# Patient Record
Sex: Male | Born: 1985 | Race: Black or African American | Hispanic: No | Marital: Single | Smoking: Current every day smoker
Health system: Southern US, Community
[De-identification: ages and names within clinical notes are randomized; demographics above are authoritative.]

## PROBLEM LIST (undated history)

## (undated) DIAGNOSIS — J36 Peritonsillar abscess: Secondary | ICD-10-CM

## (undated) HISTORY — PX: INCISION AND DRAINAGE PERITONSILLAR ABSCESS: SUR670

---

## 2018-05-01 ENCOUNTER — Other Ambulatory Visit: Payer: Self-pay

## 2018-05-01 ENCOUNTER — Emergency Department (HOSPITAL_BASED_OUTPATIENT_CLINIC_OR_DEPARTMENT_OTHER)
Admission: EM | Admit: 2018-05-01 | Discharge: 2018-05-01 | Disposition: A | Discharge status: Home / Self Care | Payer: Self-pay | Attending: Emergency Medicine | Admitting: Emergency Medicine

## 2018-05-01 ENCOUNTER — Encounter (HOSPITAL_BASED_OUTPATIENT_CLINIC_OR_DEPARTMENT_OTHER): Payer: Self-pay | Admitting: Emergency Medicine

## 2018-05-01 DIAGNOSIS — Z79899 Other long term (current) drug therapy: Secondary | ICD-10-CM | POA: Insufficient documentation

## 2018-05-01 DIAGNOSIS — F17210 Nicotine dependence, cigarettes, uncomplicated: Secondary | ICD-10-CM | POA: Insufficient documentation

## 2018-05-01 DIAGNOSIS — F1123 Opioid dependence with withdrawal: Secondary | ICD-10-CM | POA: Insufficient documentation

## 2018-05-01 MED ORDER — CLONIDINE HCL 0.1 MG PO TABS: ORAL_TABLET | ORAL | 0 refills | Status: DC

## 2018-05-01 NOTE — ED Provider Notes (Signed)
   [facility] Provider Note: Lowella DellJ. Lane Sonnie Bias, MD, FACEP  CSN: [id number] MRN: [id number] ARRIVAL: 05/01/18 at 0549 ROOM: [facility]04/[facility]04   CHIEF COMPLAINT  Withdrawal   HISTORY OF PRESENT ILLNESS  05/01/18 6:07 AM Jason Rosario is a 32 y.o. male who was in prison for 12 years and was discharged 4 days ago.  For the past 3 years he has been surreptitiously using Suboxone and has developed an addiction.  Since being discharged from prison he is developed Suboxone withdrawal.  He reports feeling fatigued, unable to sleep, restless and unable to eat or drink.  He is requesting help.   History reviewed. No pertinent past medical history.  History reviewed. No pertinent surgical history.  No family history on file.  Social History   Tobacco Use  . Smoking status: Current Every Day Smoker  Substance Use Topics  . Alcohol use: Yes  . Drug use: Not on file    Prior to Admission medications   Medication Sig Start Date End Date Taking? Authorizing Provider  Buprenorphine HCl-Naloxone HCl (SUBOXONE) 8-2 MG FILM Place 1 each under the tongue.   Yes [provider]    Allergies Patient has no known allergies.   REVIEW OF SYSTEMS  Negative except as noted here or in the History of Present Illness.   PHYSICAL EXAMINATION  Initial Vital Signs Blood pressure 138/65, pulse 78, temperature 98.5 F (36.9 C), temperature source Oral, resp. rate 20, height 6\' 3"  (1.905 m), weight 99.8 kg, SpO2 96 %.  Examination General: Well-developed, well-nourished male in no acute distress; appearance consistent with age of record HENT: normocephalic; atraumatic Eyes: pupils equal, round and reactive to light; extraocular muscles intact Neck: supple Heart: regular rate and rhythm Lungs: clear to auscultation bilaterally Abdomen: soft; nondistended; nontender; bowel sounds present Extremities: No deformity; full range of motion; pulses normal Neurologic: Awake, alert and  oriented; motor function intact in all extremities and symmetric; no facial droop; no tremor Skin: Warm and dry; no piloerection Psychiatric: Flat affect   RESULTS  Summary of this visit's results, reviewed by myself:   EKG Interpretation  Date/Time:    Ventricular Rate:    PR Interval:    QRS Duration:   QT Interval:    QTC Calculation:   R Axis:     Text Interpretation:        Laboratory Studies: No results found for this or any previous visit (from the past 24 hour(s)). Imaging Studies: No results found.  ED COURSE and MDM  Nursing notes and initial vitals signs, including pulse oximetry, reviewed.  Vitals:   05/01/18 0555  BP: 138/65  Pulse: 78  Resp: 20  Temp: 98.5 F (36.9 C)  TempSrc: Oral  SpO2: 96%  Weight: 99.8 kg  Height: 6\' 3"  (1.905 m)   Will place on clonidine taper and refer to outpatient drug rehab facilities.  PROCEDURES    ED DIAGNOSES     ICD-10-CM   1. Opioid dependence with withdrawal (HCC) F11.23        Paula LibraMolpus, Caramia Boutin, MD 05/01/18 [phone]

## 2018-05-01 NOTE — ED Triage Notes (Signed)
Pt states he has been on suboxone x 3 years. Pt was released from prison x4 days ago and does not have a prescription for suboxone. Pt reports feeling fatigued, unable to sleep, restless, unable to eat or drink.

## 2018-06-19 ENCOUNTER — Other Ambulatory Visit: Payer: Self-pay

## 2018-06-19 ENCOUNTER — Encounter (HOSPITAL_BASED_OUTPATIENT_CLINIC_OR_DEPARTMENT_OTHER): Payer: Self-pay

## 2018-06-19 ENCOUNTER — Emergency Department (HOSPITAL_BASED_OUTPATIENT_CLINIC_OR_DEPARTMENT_OTHER)
Admission: EM | Admit: 2018-06-19 | Discharge: 2018-06-19 | Disposition: A | Discharge status: Home / Self Care | Payer: Self-pay | Attending: Emergency Medicine | Admitting: Emergency Medicine

## 2018-06-19 DIAGNOSIS — Z79899 Other long term (current) drug therapy: Secondary | ICD-10-CM | POA: Insufficient documentation

## 2018-06-19 DIAGNOSIS — F17210 Nicotine dependence, cigarettes, uncomplicated: Secondary | ICD-10-CM | POA: Insufficient documentation

## 2018-06-19 DIAGNOSIS — B9789 Other viral agents as the cause of diseases classified elsewhere: Secondary | ICD-10-CM

## 2018-06-19 DIAGNOSIS — J029 Acute pharyngitis, unspecified: Secondary | ICD-10-CM | POA: Insufficient documentation

## 2018-06-19 DIAGNOSIS — J028 Acute pharyngitis due to other specified organisms: Secondary | ICD-10-CM

## 2018-06-19 LAB — GROUP A STREP BY PCR: GROUP A STREP BY PCR: NOT DETECTED

## 2018-06-19 MED ORDER — IBUPROFEN 600 MG PO TABS: 600.0000 mg | ORAL_TABLET | Freq: Four times a day (QID) | ORAL | 0 refills | Status: DC | PRN

## 2018-06-19 MED ORDER — DEXAMETHASONE SODIUM PHOSPHATE 10 MG/ML IJ SOLN
10.0000 mg | Freq: Once | INTRAMUSCULAR | Status: AC
  Administered 2018-06-19: 10 mg via INTRAMUSCULAR
  Filled 2018-06-19: qty 1

## 2018-06-19 MED ORDER — LIDOCAINE VISCOUS HCL 2 % MT SOLN
15.0000 mL | Freq: Once | OROMUCOSAL | Status: AC
  Administered 2018-06-19: 15 mL via OROMUCOSAL
  Filled 2018-06-19: qty 15

## 2018-06-19 MED ORDER — LIDOCAINE VISCOUS HCL 2 % MT SOLN: 15.0000 mL | OROMUCOSAL | 0 refills | Status: DC | PRN

## 2018-06-19 MED FILL — IBUPROFEN 600 MG TABLET: 600 | 7 days supply | Qty: 30 | Fill #0

## 2018-06-19 MED FILL — LIDOCAINE 2% VISCOUS SOLN: 2 | 7 days supply | Qty: 100 | Fill #0

## 2018-06-19 NOTE — ED Provider Notes (Signed)
[facility] EMERGENCY DEPARTMENT Provider Note   CSN: [id number] Arrival date & time: 06/19/18  1411     History   Chief Complaint Chief Complaint  Patient presents with  . URI    HPI Jason Rosario is a 32 y.o. male with no pertinent past medical history who presents to the emergency department with a chief complaint of bilateral sore throat for the last 5 to 6 days.    He reports associated worsening dysphagia over the last 2 days, but has been able to eat and drink.  Also reports headache for the last few days, and left otalgia, onset today.  He denies cough, trismus, drooling, muffled voice, shortness of breath, chest pain, nasal congestion, sinus pain or pressure, myalgias, fever, chills, dental pain, facial or neck swelling, neck pain or stiffness, or tinnitus.  He is not up-to-date on his influenza vaccination this year.  He smokes a pack of Black and mild daily.  No known sick contacts.  He reports he took 2 doses of Mucinex starting last night and 1 dose of Tylenol with no improvement in his symptoms.  The history is provided by the patient. No language interpreter was used.    History reviewed. No pertinent past medical history.  There are no active problems to display for this patient.   History reviewed. No pertinent surgical history.      Home Medications    Prior to Admission medications   Medication Sig Start Date End Date Taking? Authorizing Provider  cloNIDine (CATAPRES) 0.1 MG tablet Days 1-4: Two tablets four times a day. Days 5-6: One tablet four times a day. Days 7-8: 1/2 tablet four times a day. Cut doses in half or discontinue if you feel lightheaded or pass out. 05/01/18   Molpus, Jonny Ruiz, MD  ibuprofen (ADVIL,MOTRIN) 600 MG tablet Take 1 tablet (600 mg total) by mouth every 6 (six) hours as needed. 06/19/18   Karis Rilling A, PA-C  lidocaine (XYLOCAINE) 2 % solution Use as directed 15 mLs in the mouth or throat as needed for mouth pain.  06/19/18   Jaremy Nosal A, PA-C    Family History No family history on file.  Social History Social History   Tobacco Use  . Smoking status: Current Every Day Smoker    Types: Cigarettes  . Smokeless tobacco: Never Used  Substance Use Topics  . Alcohol use: Yes    Comment: occ  . Drug use: Never     Allergies   Patient has no known allergies.   Review of Systems Review of Systems  Constitutional: Negative for activity change, appetite change, chills and fever.  HENT: Positive for ear pain and sore throat. Negative for congestion, dental problem, drooling, facial swelling, rhinorrhea, sinus pressure, sinus pain, trouble swallowing and voice change.   Respiratory: Negative for cough and shortness of breath.   Cardiovascular: Negative for chest pain.  Gastrointestinal: Negative for abdominal pain, diarrhea, nausea and vomiting.  Genitourinary: Negative for dysuria.  Musculoskeletal: Negative for back pain.  Skin: Negative for rash.  Allergic/Immunologic: Negative for immunocompromised state.  Neurological: Negative for headaches.  Psychiatric/Behavioral: Negative for confusion.     Physical Exam Updated Vital Signs BP (!) 157/83 (BP Location: Right Arm)   Pulse 86   Temp 98.7 F (37.1 C) (Oral)   Resp 16   Ht 6\' 3"  (1.905 m)   Wt 99.8 kg   SpO2 98%   BMI 27.50 kg/m   Physical Exam  Constitutional: He appears well-developed.  HENT:  Head: Normocephalic.  Right Ear: Hearing, external ear and ear canal normal. Tympanic membrane is not erythematous and not bulging. A middle ear effusion is present.  Left Ear: Hearing, external ear and ear canal normal. Tympanic membrane is not erythematous and not bulging. A middle ear effusion is present.  Nose: Mucosal edema present. No rhinorrhea. Right sinus exhibits no maxillary sinus tenderness and no frontal sinus tenderness. Left sinus exhibits no maxillary sinus tenderness and no frontal sinus tenderness.  Mouth/Throat:  Uvula is midline and mucous membranes are normal. No trismus in the jaw. No uvula swelling. No oropharyngeal exudate, posterior oropharyngeal edema, posterior oropharyngeal erythema or tonsillar abscesses. Tonsils are 2+ on the right. Tonsils are 2+ on the left. Tonsillar exudate.  No sublingual edema or induration.  Eyes: Conjunctivae are normal. No scleral icterus.  Neck: Normal range of motion. Neck supple.  Shotty bilateral anterior cervical lymphadenopathy.  No meningismus.  Cardiovascular: Normal rate, regular rhythm, normal heart sounds and intact distal pulses. Exam reveals no gallop and no friction rub.  No murmur heard. Pulmonary/Chest: Effort normal and breath sounds normal. No stridor. No respiratory distress. He has no wheezes. He has no rales. He exhibits no tenderness.  Abdominal: Soft. He exhibits no distension.  Neurological: He is alert.  Skin: Skin is warm and dry.  Psychiatric: His behavior is normal.  Nursing note and vitals reviewed.    ED Treatments / Results  Labs (all labs ordered are listed, but only abnormal results are displayed) Labs Reviewed  GROUP A STREP BY PCR    EKG None  Radiology No results found.  Procedures Procedures (including critical care time)  Medications Ordered in ED Medications  dexamethasone (DECADRON) injection 10 mg (has no administration in time range)  lidocaine (XYLOCAINE) 2 % viscous mouth solution 15 mL (15 mLs Mouth/Throat Given 06/19/18 1445)     Initial Impression / Assessment and Plan / ED Course  I have reviewed the triage vital signs and the nursing notes.  Pertinent labs & imaging results that were available during my care of the patient were reviewed by me and considered in my medical decision making (see chart for details).     32 year old male with no pertinent past medical history presenting with sore throat, dysphagia, headache, and left otalgia.  No constitutional symptoms.  Vital signs are reassuring in  the ED as the patient is afebrile, normotensive, without tachycardia.  On exam, patient has 2+ bilateral tonsils with exudates and shotty anterior cervical lymphadenopathy.  Meningismus.  Low suspicion for influenza, meningitis, acute bacterial sinusitis, Ludwig's angina, peritonsillar abscess, retropharyngeal abscess, or AOM. will order strep PCR and give viscous lidocaine for pain.    Strep PCR is negative.  Decadron given in the ED. suspect viral etiology at this time.  Will discharge with viscous lidocaine and NSAIDs.  Recommended follow-up with his PCP if his symptoms do not improve in the next 4 to 5 days.  Doubt oropharyngeal STI or mononucleosis at this time.  Strict return precautions given.  He is hemodynamically stable and in no acute distress.  He is safe for discharge home with outpatient follow-up.   Final Clinical Impressions(s) / ED Diagnoses   Final diagnoses:  Acute viral pharyngitis    ED Discharge Orders         Ordered    lidocaine (XYLOCAINE) 2 % solution  As needed     06/19/18 1540    ibuprofen (ADVIL,MOTRIN) 600 MG tablet  Every 6 hours PRN  06/19/18 1540           Frederik Pear A, PA-C 06/19/18 1544    Maia Plan, MD 06/19/18 [phone]

## 2018-06-19 NOTE — Discharge Instructions (Addendum)
Thank you for allowing me to care for you today in the Emergency Department.   You can swish and spit warm salt water every 6 hours to help with sore throat and pain in her mouth.  Take 600 mg of ibuprofen every 6 hours with food to help with pain, inflammation, and headache.  You can swallow 15 mL of viscous lidocaine every 3 hours as needed for sore throat.  Eating and drinking hot and cold beverages may also be easier to swallow than things that are room temperature.  Call the number on your discharge paper to get established with a primary care provider for follow-up if your symptoms do not start to improve in the next 4 to 5 days.  Return to the emergency department if you develop difficulty breathing, feeling as if your throat is closing, if you become unable to open your mouth, if you develop significant swelling to one side of your face or neck, or other new, concerning symptoms.

## 2018-06-19 NOTE — ED Triage Notes (Signed)
C/o URI x 5 days-NAD-steady gait

## 2018-06-24 ENCOUNTER — Other Ambulatory Visit: Payer: Self-pay

## 2018-06-24 ENCOUNTER — Emergency Department (HOSPITAL_BASED_OUTPATIENT_CLINIC_OR_DEPARTMENT_OTHER): Payer: Self-pay

## 2018-06-24 ENCOUNTER — Emergency Department (HOSPITAL_BASED_OUTPATIENT_CLINIC_OR_DEPARTMENT_OTHER)
Admission: EM | Admit: 2018-06-24 | Discharge: 2018-06-24 | Disposition: A | Discharge status: Home / Self Care | Payer: Self-pay | Attending: Emergency Medicine | Admitting: Emergency Medicine

## 2018-06-24 ENCOUNTER — Encounter (HOSPITAL_BASED_OUTPATIENT_CLINIC_OR_DEPARTMENT_OTHER): Payer: Self-pay

## 2018-06-24 DIAGNOSIS — F17210 Nicotine dependence, cigarettes, uncomplicated: Secondary | ICD-10-CM | POA: Insufficient documentation

## 2018-06-24 DIAGNOSIS — J36 Peritonsillar abscess: Secondary | ICD-10-CM | POA: Insufficient documentation

## 2018-06-24 DIAGNOSIS — Z79899 Other long term (current) drug therapy: Secondary | ICD-10-CM | POA: Insufficient documentation

## 2018-06-24 LAB — CBC WITH DIFFERENTIAL/PLATELET
Abs Immature Granulocytes: 0.02 10*3/uL (ref 0.00–0.07)
BASOS PCT: 1 %
Basophils Absolute: 0.1 10*3/uL (ref 0.0–0.1)
EOS ABS: 0.3 10*3/uL (ref 0.0–0.5)
Eosinophils Relative: 3 %
HCT: 45 % (ref 39.0–52.0)
Hemoglobin: 14.5 g/dL (ref 13.0–17.0)
Immature Granulocytes: 0 %
Lymphocytes Relative: 12 %
Lymphs Abs: 1.3 10*3/uL (ref 0.7–4.0)
MCH: 31.7 pg (ref 26.0–34.0)
MCHC: 32.2 g/dL (ref 30.0–36.0)
MCV: 98.3 fL (ref 80.0–100.0)
MONO ABS: 0.9 10*3/uL (ref 0.1–1.0)
MONOS PCT: 8 %
NEUTROS PCT: 76 %
Neutro Abs: 8.3 10*3/uL — ABNORMAL HIGH (ref 1.7–7.7)
Platelets: 282 10*3/uL (ref 150–400)
RBC: 4.58 MIL/uL (ref 4.22–5.81)
RDW: 13.1 % (ref 11.5–15.5)
WBC: 10.9 10*3/uL — AB (ref 4.0–10.5)
nRBC: 0 % (ref 0.0–0.2)

## 2018-06-24 LAB — GROUP A STREP BY PCR: GROUP A STREP BY PCR: NOT DETECTED

## 2018-06-24 MED ORDER — CLINDAMYCIN PHOSPHATE 600 MG/50ML IV SOLN
600.0000 mg | Freq: Once | INTRAVENOUS | Status: AC
  Administered 2018-06-24: 600 mg via INTRAVENOUS
  Filled 2018-06-24: qty 50

## 2018-06-24 MED ORDER — DEXAMETHASONE SODIUM PHOSPHATE 10 MG/ML IJ SOLN
10.0000 mg | Freq: Once | INTRAMUSCULAR | Status: AC
  Administered 2018-06-24: 10 mg via INTRAVENOUS
  Filled 2018-06-24: qty 1

## 2018-06-24 MED ORDER — IOPAMIDOL (ISOVUE-300) INJECTION 61%
100.0000 mL | Freq: Once | INTRAVENOUS | Status: AC | PRN
  Administered 2018-06-24: 75 mL via INTRAVENOUS

## 2018-06-24 MED ORDER — SODIUM CHLORIDE 0.9 % IV BOLUS
1000.0000 mL | Freq: Once | INTRAVENOUS | Status: AC
  Administered 2018-06-24: 1000 mL via INTRAVENOUS

## 2018-06-24 MED ORDER — KETOROLAC TROMETHAMINE 15 MG/ML IJ SOLN
15.0000 mg | Freq: Once | INTRAMUSCULAR | Status: AC
  Administered 2018-06-24: 15 mg via INTRAVENOUS
  Filled 2018-06-24: qty 1

## 2018-06-24 NOTE — ED Provider Notes (Signed)
[facility] EMERGENCY DEPARTMENT Provider Note   CSN: [id number] Arrival date & time: 06/24/18  1104     History   Chief Complaint Chief Complaint  Patient presents with  . Sore Throat    HPI Jason Rosario is a 32 y.o. male in for evaluation of sore throat.  Patient states he has had a sore throat for the past 2 weeks.  He was seen 5 days ago, had a negative strep test and discharged with a likely viral illness.  Patient reports symptoms have worsened in the past several days, despite viscous lidocaine and Motrin.  Patient reports increased pain, pain extending into his jaw, and increased left ear pain.  He reports difficulty swallowing due to pain.  He has associated sinus congestion.  He denies fevers, chills, cough, chest pain, nausea, vomiting, abdominal pain.  He has no medical problems, takes no medications daily.  He is immunocompetent. He denies sick contacts. Patient states 2 months ago, he had a left lower tooth extraction, had no issues since.  He reports pain in the left jaw, but not of the tooth itself. Pt states his voice sounds muffled, and it hurts to open his mouth.   HPI  History reviewed. No pertinent past medical history.  There are no active problems to display for this patient.   History reviewed. No pertinent surgical history.      Home Medications    Prior to Admission medications   Medication Sig Start Date End Date Taking? Authorizing Provider  ibuprofen (ADVIL,MOTRIN) 600 MG tablet Take 1 tablet (600 mg total) by mouth every 6 (six) hours as needed. 06/19/18  Yes McDonald, Mia A, PA-C  lidocaine (XYLOCAINE) 2 % solution Use as directed 15 mLs in the mouth or throat as needed for mouth pain. 06/19/18  Yes McDonald, Mia A, PA-C  cloNIDine (CATAPRES) 0.1 MG tablet Days 1-4: Two tablets four times a day. Days 5-6: One tablet four times a day. Days 7-8: 1/2 tablet four times a day. Cut doses in half or discontinue if you feel lightheaded or  pass out. 05/01/18   Molpus, Jonny Ruiz, MD    Family History No family history on file.  Social History Social History   Tobacco Use  . Smoking status: Current Every Day Smoker    Types: Cigarettes  . Smokeless tobacco: Never Used  Substance Use Topics  . Alcohol use: Yes    Comment: occ  . Drug use: Never     Allergies   Patient has no known allergies.   Review of Systems Review of Systems  HENT: Positive for congestion, rhinorrhea, sinus pressure, sinus pain, sore throat, trouble swallowing and voice change.        L low jaw pain  All other systems reviewed and are negative.    Physical Exam Updated Vital Signs BP 134/80 (BP Location: Right Arm)   Pulse 84   Temp 98.8 F (37.1 C) (Oral)   Resp 20   Ht 6\' 3"  (1.905 m)   Wt 99.8 kg   SpO2 96%   BMI 27.50 kg/m   Physical Exam  Constitutional: He is oriented to person, place, and time. He appears well-developed and well-nourished.  Appears uncomfortable, but nontoxic  HENT:  Head: Normocephalic and atraumatic.  Right Ear: External ear and ear canal normal. Tympanic membrane is not erythematous and not bulging. A middle ear effusion is present.  Left Ear: External ear and ear canal normal. Tympanic membrane is not erythematous and not bulging.  A middle ear effusion is present.  Nose: Mucosal edema and rhinorrhea present. Right sinus exhibits no maxillary sinus tenderness and no frontal sinus tenderness. Left sinus exhibits maxillary sinus tenderness and frontal sinus tenderness.  Mouth/Throat: Uvula is midline. Mucous membranes are dry. There is trismus in the jaw. Posterior oropharyngeal edema and posterior oropharyngeal erythema present. Tonsils are 1+ on the right. Tonsils are 3+ on the left.  Left tonsil significantly swollen and erythematous.  No obvious exudate.  Uvula is midline.  Trismus noted.  Muffled voice.  Handling secretions.  OP erythematous and edematous.  No obvious exudate.  No tenderness under the  tongue.  No dental tenderness.  Eyes: Pupils are equal, round, and reactive to light. Conjunctivae and EOM are normal.  Neck: Normal range of motion. Neck supple.  Cardiovascular: Normal rate, regular rhythm and intact distal pulses.  Pulmonary/Chest: Effort normal and breath sounds normal. No respiratory distress. He has no wheezes.  Abdominal: Soft. He exhibits no distension. There is no tenderness.  Musculoskeletal: Normal range of motion.  Neurological: He is alert and oriented to person, place, and time.  Skin: Skin is warm and dry. Capillary refill takes less than 2 seconds.  Psychiatric: He has a normal mood and affect.  Nursing note and vitals reviewed.    ED Treatments / Results  Labs (all labs ordered are listed, but only abnormal results are displayed) Labs Reviewed  CBC WITH DIFFERENTIAL/PLATELET - Abnormal; Notable for the following components:      Result Value   WBC 10.9 (*)    Neutro Abs 8.3 (*)    All other components within normal limits  GROUP A STREP BY PCR  BASIC METABOLIC PANEL    EKG None  Radiology Ct Soft Tissue Neck W Contrast  Result Date: 06/24/2018 CLINICAL DATA:  Sore throat, difficulty swallowing. EXAM: CT NECK WITH CONTRAST TECHNIQUE: Multidetector CT imaging of the neck was performed using the standard protocol following the bolus administration of intravenous contrast. CONTRAST:  75mL ISOVUE-300 IOPAMIDOL (ISOVUE-300) INJECTION 61% COMPARISON:  None. FINDINGS: Pharynx and larynx: Extensive swelling and edema in the left tonsil. Focal fluid collection left tonsil measuring 24 x 26 mm compatible with abscess. Mild narrowing of the airway. Epiglottis and larynx normal. Salivary glands: No inflammation, mass, or stone. Thyroid: Negative Lymph nodes: Left level 2 lymph node 10 mm. Left level 5 node 16 mm. This is most likely related to acute infection. Right level 2 lymph node 9 mm. Vascular: Negative Limited intracranial: Negative Visualized orbits:  Negative Mastoids and visualized paranasal sinuses: Mild mucosal edema paranasal sinuses. Skeleton: Negative Upper chest: Negative Other: None IMPRESSION: 24 x 26 mm left tonsillar abscess with mild narrowing of the airway. Reactive adenopathy in the neck. Electronically Signed   By: Marlan Palau M.D.   On: 06/24/2018 13:40    Procedures Procedures (including critical care time)  Medications Ordered in ED Medications  sodium chloride 0.9 % bolus 1,000 mL ( Intravenous Stopped 06/24/18 1403)  clindamycin (CLEOCIN) IVPB 600 mg ( Intravenous Stopped 06/24/18 1345)  dexamethasone (DECADRON) injection 10 mg (10 mg Intravenous Given 06/24/18 1259)  ketorolac (TORADOL) 15 MG/ML injection 15 mg (15 mg Intravenous Given 06/24/18 1259)  iopamidol (ISOVUE-300) 61 % injection 100 mL (75 mLs Intravenous Contrast Given 06/24/18 1311)     Initial Impression / Assessment and Plan / ED Course  I have reviewed the triage vital signs and the nursing notes.  Pertinent labs & imaging results that were available during my care  of the patient were reviewed by me and considered in my medical decision making (see chart for details).     Pt presenting for evaluation of sore throat.  Physical exam concerning, patient with muscle voice, trismus, and obviously swollen left tonsil.  Concern for peritonsillar abscess.  Additionally, patient with left low jaw pain, consider possible Ludwig's, although lower suspicion due to no pain under the tongue or dental pain.  Will start fluids, Decadron, and antibiotics while CT is pending.  Labs with mild leukocytosis of 10.9, otherwise reassuring. CT shows signs of 2-1/2 x 2-1/2 cm left peritonsillar abscess.  Discussed with Dr. Lazarus Salines from ENT, who recommends pt go immediately to his office for I and D.  Reassessment, patient reports symptoms are improved with IV medications.  Discussed plan to follow-up with Dr. Lazarus Salines.  Discussed patient is to go immediately, he is not to  stop to eat or drink.  Patient is agreeable to plan.  At this time, patient appears safe for discharge.  Return percussions given.  Patient states he understands.   Final Clinical Impressions(s) / ED Diagnoses   Final diagnoses:  Peritonsillar abscess    ED Discharge Orders    None       Alveria Apley, PA-C 06/24/18 1442    Gwyneth Sprout, MD 06/24/18 [phone]

## 2018-06-24 NOTE — Discharge Instructions (Addendum)
Drive directly to Dr. Raye SorrowWolicki's office.  The information for his office is listed below. When you show up at the office, tell them that you were in the ER and were told to follow-up in the office for your peritonsillar abscess. Do not stop and get anything to eat or drink on the way.

## 2018-06-24 NOTE — ED Notes (Signed)
Pt to report to ENT and remain NPO. Pt voiced understanding. No concerns or questions at this time.

## 2018-06-24 NOTE — ED Triage Notes (Signed)
Pt seen on 11/7 for sore throat-states is worse-NAD-steady gait

## 2018-08-16 ENCOUNTER — Encounter (HOSPITAL_BASED_OUTPATIENT_CLINIC_OR_DEPARTMENT_OTHER): Payer: Self-pay

## 2018-08-16 ENCOUNTER — Emergency Department (HOSPITAL_BASED_OUTPATIENT_CLINIC_OR_DEPARTMENT_OTHER)
Admission: EM | Admit: 2018-08-16 | Discharge: 2018-08-16 | Disposition: A | Discharge status: Home / Self Care | Payer: Self-pay | Attending: Emergency Medicine | Admitting: Emergency Medicine

## 2018-08-16 ENCOUNTER — Other Ambulatory Visit: Payer: Self-pay

## 2018-08-16 DIAGNOSIS — Z79899 Other long term (current) drug therapy: Secondary | ICD-10-CM | POA: Insufficient documentation

## 2018-08-16 DIAGNOSIS — F17210 Nicotine dependence, cigarettes, uncomplicated: Secondary | ICD-10-CM | POA: Insufficient documentation

## 2018-08-16 DIAGNOSIS — J36 Peritonsillar abscess: Secondary | ICD-10-CM | POA: Insufficient documentation

## 2018-08-16 LAB — CBC WITH DIFFERENTIAL/PLATELET
Abs Immature Granulocytes: 0.03 10*3/uL (ref 0.00–0.07)
BASOS ABS: 0.1 10*3/uL (ref 0.0–0.1)
Basophils Relative: 1 %
EOS ABS: 0.1 10*3/uL (ref 0.0–0.5)
EOS PCT: 1 %
HEMATOCRIT: 47.9 % (ref 39.0–52.0)
HEMOGLOBIN: 15.7 g/dL (ref 13.0–17.0)
Immature Granulocytes: 0 %
LYMPHS PCT: 15 %
Lymphs Abs: 1.6 10*3/uL (ref 0.7–4.0)
MCH: 32.6 pg (ref 26.0–34.0)
MCHC: 32.8 g/dL (ref 30.0–36.0)
MCV: 99.6 fL (ref 80.0–100.0)
Monocytes Absolute: 1 10*3/uL (ref 0.1–1.0)
Monocytes Relative: 9 %
NEUTROS ABS: 8 10*3/uL — AB (ref 1.7–7.7)
Neutrophils Relative %: 74 %
Platelets: 237 10*3/uL (ref 150–400)
RBC: 4.81 MIL/uL (ref 4.22–5.81)
RDW: 13.2 % (ref 11.5–15.5)
WBC: 10.8 10*3/uL — AB (ref 4.0–10.5)
nRBC: 0 % (ref 0.0–0.2)

## 2018-08-16 LAB — BASIC METABOLIC PANEL
Anion gap: 7 (ref 5–15)
BUN: 6 mg/dL (ref 6–20)
CO2: 32 mmol/L (ref 22–32)
Calcium: 10.1 mg/dL (ref 8.9–10.3)
Chloride: 98 mmol/L (ref 98–111)
Creatinine, Ser: 0.83 mg/dL (ref 0.61–1.24)
GFR calc Af Amer: >60 mL/min (ref >60)
GFR calc non Af Amer: >60 mL/min (ref >60)
Glucose, Bld: 82 mg/dL (ref 70–99)
POTASSIUM: 4.2 mmol/L (ref 3.5–5.1)
SODIUM: 137 mmol/L (ref 135–145)

## 2018-08-16 LAB — GROUP A STREP BY PCR: Group A Strep by PCR: NOT DETECTED

## 2018-08-16 MED ORDER — CLINDAMYCIN PHOSPHATE 600 MG/50ML IV SOLN
600.0000 mg | Freq: Once | INTRAVENOUS | Status: AC
  Administered 2018-08-16: 600 mg via INTRAVENOUS
  Filled 2018-08-16: qty 50

## 2018-08-16 MED ORDER — CLINDAMYCIN HCL 150 MG PO CAPS: 450.0000 mg | ORAL_CAPSULE | Freq: Four times a day (QID) | ORAL | 0 refills | Status: AC

## 2018-08-16 MED ORDER — SODIUM CHLORIDE 0.9 % IV BOLUS
1000.0000 mL | Freq: Once | INTRAVENOUS | Status: AC
  Administered 2018-08-16: 1000 mL via INTRAVENOUS

## 2018-08-16 MED ORDER — DEXAMETHASONE SODIUM PHOSPHATE 10 MG/ML IJ SOLN
10.0000 mg | Freq: Once | INTRAMUSCULAR | Status: AC
  Administered 2018-08-16: 10 mg via INTRAVENOUS
  Filled 2018-08-16: qty 1

## 2018-08-16 MED ORDER — PROBIOTIC 250 MG PO CAPS: 1.0000 | ORAL_CAPSULE | Freq: Every day | ORAL | 0 refills | Status: DC

## 2018-08-16 MED ORDER — SODIUM CHLORIDE 0.9 % IV SOLN
INTRAVENOUS | Status: DC | PRN
  Administered 2018-08-16: 250 mL via INTRAVENOUS

## 2018-08-16 NOTE — Discharge Instructions (Signed)
Take clindamycin with food as prescribed for 14 days unless told otherwise by Dr. Lazarus SalinesWolicki.  Take a probiotic daily while taking this medication.  Please call Dr. Raye SorrowWolicki's office Monday to make an appointment next week.  Please return to the emergency department if you develop any new or worsening symptoms including fever over 100.4, lockjaw, feeling like you are going to pass out, or any other concerning symptoms.

## 2018-08-16 NOTE — ED Notes (Signed)
ED Provider at bedside. 

## 2018-08-16 NOTE — ED Provider Notes (Signed)
[facility] EMERGENCY DEPARTMENT Provider Note   CSN: [id number] Arrival date & time: 08/16/18  1424     History   Chief Complaint Chief Complaint  Patient presents with  . Sore Throat    HPI Jason Rosario is a 33 y.o. male with recent history of peritonsillar abscess who presents with return of sore throat and drainage to the back of his throat.  He denies any fever.  He reports his symptoms returned over the past few days.  He finished amoxicillin about 3 weeks ago.  He had an incision and drainage of the peritonsillar abscess in the office on 06/24/2018 by Dr. Lazarus Salines.  He reports he had gotten better.  He has not been taking any medications at home for symptoms.  He does have some change in his voice, however he is able to tolerate fluids and eat.  HPI  History reviewed. No pertinent past medical history.  There are no active problems to display for this patient.   Past Surgical History:  Procedure Laterality Date  . INCISION AND DRAINAGE PERITONSILLAR ABSCESS          Home Medications    Prior to Admission medications   Medication Sig Start Date End Date Taking? Authorizing Provider  clindamycin (CLEOCIN) 150 MG capsule Take 3 capsules (450 mg total) by mouth every 6 (six) hours for 14 days. 08/16/18 08/30/18  Emi Holes, PA-C  cloNIDine (CATAPRES) 0.1 MG tablet Days 1-4: Two tablets four times a day. Days 5-6: One tablet four times a day. Days 7-8: 1/2 tablet four times a day. Cut doses in half or discontinue if you feel lightheaded or pass out. 05/01/18   Molpus, Jonny Ruiz, MD  ibuprofen (ADVIL,MOTRIN) 600 MG tablet Take 1 tablet (600 mg total) by mouth every 6 (six) hours as needed. 06/19/18   McDonald, Mia A, PA-C  lidocaine (XYLOCAINE) 2 % solution Use as directed 15 mLs in the mouth or throat as needed for mouth pain. 06/19/18   McDonald, Mia A, PA-C  Saccharomyces boulardii (PROBIOTIC) 250 MG CAPS Take 1 capsule by mouth daily. 08/16/18   Emi Holes,  PA-C    Family History No family history on file.  Social History Social History   Tobacco Use  . Smoking status: Current Every Day Smoker    Types: Cigarettes  . Smokeless tobacco: Never Used  Substance Use Topics  . Alcohol use: Yes    Comment: occ  . Drug use: Never     Allergies   Patient has no known allergies.   Review of Systems Review of Systems  Constitutional: Negative for chills and fever.  HENT: Positive for sore throat and trouble swallowing. Negative for facial swelling.   Respiratory: Negative for shortness of breath.   Cardiovascular: Negative for chest pain.  Gastrointestinal: Negative for abdominal pain, nausea and vomiting.  Genitourinary: Negative for dysuria.  Musculoskeletal: Negative for back pain and neck pain.  Skin: Negative for rash and wound.  Neurological: Negative for headaches.  Psychiatric/Behavioral: The patient is not nervous/anxious.      Physical Exam Updated Vital Signs BP 136/87 (BP Location: Left Arm)   Pulse 84   Temp 99.5 F (37.5 C) (Oral)   Resp 20   Ht 6\' 3"  (1.905 m)   Wt 95.3 kg   SpO2 99%   BMI 26.25 kg/m   Physical Exam Vitals signs and nursing note reviewed.  Constitutional:      General: He is not in acute distress.  Appearance: He is well-developed. He is not diaphoretic.  HENT:     Head: Normocephalic and atraumatic.     Right Ear: Tympanic membrane normal.     Left Ear: Tympanic membrane normal.     Mouth/Throat:     Pharynx: No oropharyngeal exudate.     Tonsils: Tonsillar exudate and tonsillar abscess present. Swelling: 3+ on the right. 3+ on the left.     Comments: Tonsillar hypertrophy bilaterally, peritonsillar abscess actively draining yellow, purulent fluid on the left Eyes:     General: No scleral icterus.       Right eye: No discharge.        Left eye: No discharge.     Conjunctiva/sclera: Conjunctivae normal.     Pupils: Pupils are equal, round, and reactive to light.  Neck:      Musculoskeletal: Normal range of motion and neck supple.     Thyroid: No thyromegaly.  Cardiovascular:     Rate and Rhythm: Normal rate and regular rhythm.     Heart sounds: Normal heart sounds. No murmur. No friction rub. No gallop.   Pulmonary:     Effort: Pulmonary effort is normal. No respiratory distress.     Breath sounds: Normal breath sounds. No stridor. No wheezing or rales.  Abdominal:     General: Bowel sounds are normal. There is no distension.     Palpations: Abdomen is soft.     Tenderness: There is no abdominal tenderness. There is no guarding or rebound.  Lymphadenopathy:     Cervical: No cervical adenopathy.  Skin:    General: Skin is warm and dry.     Coloration: Skin is not pale.     Findings: No rash.  Neurological:     Mental Status: He is alert.     Coordination: Coordination normal.      ED Treatments / Results  Labs (all labs ordered are listed, but only abnormal results are displayed) Labs Reviewed  CBC WITH DIFFERENTIAL/PLATELET - Abnormal; Notable for the following components:      Result Value   WBC 10.8 (*)    Neutro Abs 8.0 (*)    All other components within normal limits  GROUP A STREP BY PCR  AEROBIC CULTURE (SUPERFICIAL SPECIMEN)  BASIC METABOLIC PANEL    EKG None  Radiology No results found.  Procedures Procedures (including critical care time)  Medications Ordered in ED Medications  0.9 %  sodium chloride infusion (250 mLs Intravenous New Bag/Given 08/16/18 1652)  dexamethasone (DECADRON) injection 10 mg (10 mg Intravenous Given 08/16/18 1647)  sodium chloride 0.9 % bolus 1,000 mL (0 mLs Intravenous Stopped 08/16/18 1800)  clindamycin (CLEOCIN) IVPB 600 mg (0 mg Intravenous Stopped 08/16/18 1729)     Initial Impression / Assessment and Plan / ED Course  I have reviewed the triage vital signs and the nursing notes.  Pertinent labs & imaging results that were available during my care of the patient were reviewed by me and  considered in my medical decision making (see chart for details).     Patient with mild leukocytosis at 10.8.  Otherwise labs are unremarkable.  Strep is negative.  Patient with return of peritonsillar abscess, which is actively draining in the ED.  Patient given IV clindamycin, Decadron, fluids in the ED.  I discussed patient case with ENT specialist on-call, Dr. Pollyann Kennedyosen, who advised to follow-up in the office this week and oral clindamycin.  Patient understands and agrees with plan.  Return precautions discussed.  Patient vitals stable throughout ED course and discharged in satisfactory condition. I discussed patient case with Dr. Donnald Garre who guided the patient's management and agrees with plan.   Final Clinical Impressions(s) / ED Diagnoses   Final diagnoses:  Peritonsillar abscess    ED Discharge Orders         Ordered    clindamycin (CLEOCIN) 150 MG capsule  Every 6 hours     08/16/18 1756    Saccharomyces boulardii (PROBIOTIC) 250 MG CAPS  Daily     08/16/18 1756           Emi Holes, PA-C 08/16/18 1804    Arby Barrette, MD 08/17/18 2212

## 2018-08-16 NOTE — ED Triage Notes (Signed)
Pt c/o sore throat with swelling. Pt reports he had a peritonsillar abscess x 1 month ago, but symptoms have returned. Note swelling and purulent exudate in triage. Pt drinking water during triage. No resp distress.

## 2018-08-19 LAB — AEROBIC CULTURE  (SUPERFICIAL SPECIMEN)

## 2018-08-19 LAB — AEROBIC CULTURE W GRAM STAIN (SUPERFICIAL SPECIMEN)
Culture: NORMAL
Special Requests: NORMAL

## 2018-10-14 ENCOUNTER — Emergency Department (HOSPITAL_COMMUNITY)
Admission: EM | Admit: 2018-10-14 | Discharge: 2018-10-15 | Disposition: A | Discharge status: Home / Self Care | Payer: Self-pay | Attending: Emergency Medicine | Admitting: Emergency Medicine

## 2018-10-14 ENCOUNTER — Encounter (HOSPITAL_COMMUNITY): Payer: Self-pay | Admitting: Emergency Medicine

## 2018-10-14 ENCOUNTER — Other Ambulatory Visit: Payer: Self-pay

## 2018-10-14 DIAGNOSIS — K5900 Constipation, unspecified: Secondary | ICD-10-CM | POA: Insufficient documentation

## 2018-10-14 DIAGNOSIS — Z5321 Procedure and treatment not carried out due to patient leaving prior to being seen by health care provider: Secondary | ICD-10-CM | POA: Insufficient documentation

## 2018-10-14 NOTE — ED Triage Notes (Signed)
Pt c/o harden stools x 2 weeks. Pt states when he strains to use the restroom there is blood in the toilet.

## 2019-05-27 ENCOUNTER — Emergency Department
Admission: EM | Admit: 2019-05-27 | Discharge: 2019-05-27 | Discharge status: Absent without leave | Payer: Self-pay | Source: Home / Self Care

## 2019-05-27 ENCOUNTER — Emergency Department (INDEPENDENT_AMBULATORY_CARE_PROVIDER_SITE_OTHER): Payer: Self-pay

## 2019-05-27 ENCOUNTER — Encounter: Payer: Self-pay | Admitting: Family Medicine

## 2019-05-27 ENCOUNTER — Emergency Department
Admission: EM | Admit: 2019-05-27 | Discharge: 2019-05-27 | Disposition: A | Discharge status: Home / Self Care | Payer: Self-pay | Source: Home / Self Care

## 2019-05-27 ENCOUNTER — Other Ambulatory Visit: Payer: Self-pay

## 2019-05-27 DIAGNOSIS — R05 Cough: Secondary | ICD-10-CM

## 2019-05-27 DIAGNOSIS — B9789 Other viral agents as the cause of diseases classified elsewhere: Secondary | ICD-10-CM

## 2019-05-27 DIAGNOSIS — J069 Acute upper respiratory infection, unspecified: Secondary | ICD-10-CM

## 2019-05-27 DIAGNOSIS — R059 Cough, unspecified: Secondary | ICD-10-CM

## 2019-05-27 DIAGNOSIS — R0602 Shortness of breath: Secondary | ICD-10-CM

## 2019-05-27 MED ORDER — ALBUTEROL SULFATE HFA 108 (90 BASE) MCG/ACT IN AERS: 2.0000 | INHALATION_SPRAY | RESPIRATORY_TRACT | 1 refills | Status: DC | PRN

## 2019-05-27 MED ORDER — AZITHROMYCIN 250 MG PO TABS: ORAL_TABLET | ORAL | 0 refills | Status: DC

## 2019-05-27 NOTE — ED Provider Notes (Signed)
[facility]    CSN: [id number] Arrival date & time: 05/27/19  1358      History   Chief Complaint Chief Complaint  Patient presents with  . Cough    HPI Jason Rosario is a 33 y.o. male.   33 year old male making initial visit to [facility] urgent care.  Patient's chief complaint is cough and shortness of breath today.  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  He is furloughed from his [employer] job and lives with his mother, who is well.  The problem began 2 days ago with worsening cough, productive of thick yellow phlegm, and shortnss of breath and wheezing.  No h/o asthma or use of inhalers.      History reviewed. No pertinent past medical history.  There are no active problems to display for this patient.   Past Surgical History:  Procedure Laterality Date  . INCISION AND DRAINAGE PERITONSILLAR ABSCESS         Home Medications    Prior to Admission medications   Medication Sig Start Date End Date Taking? Authorizing Provider  albuterol (VENTOLIN HFA) 108 (90 Base) MCG/ACT inhaler Inhale 2 puffs into the lungs every 4 (four) hours as needed for wheezing or shortness of breath (cough, shortness of breath or wheezing.). 05/27/19   Elvina Sidle, MD  azithromycin (ZITHROMAX) 250 MG tablet Take 2 tabs PO x 1 dose, then 1 tab PO QD x 4 days 05/27/19   Elvina Sidle, MD  ibuprofen (ADVIL,MOTRIN) 600 MG tablet Take 1 tablet (600 mg total) by mouth every 6 (six) hours as needed. 06/19/18   McDonald, Mia A, PA-C    Family History Family History  Problem Relation Age of Onset  . Healthy Mother   . Healthy Father     Social History Social History   Tobacco Use  . Smoking status: Current Every Day Smoker    Types: Cigarettes  . Smokeless tobacco: Never Used  Substance Use Topics  . Alcohol use: Yes    Comment: occ  . Drug use: Never     Allergies   Patient has no known allergies.   Review of Systems Review of  Systems  Constitutional: Positive for fatigue. Negative for chills, diaphoresis and fever.  HENT: Positive for congestion.   Respiratory: Positive for cough, shortness of breath and wheezing.   Gastrointestinal: Negative.   Genitourinary: Negative.   Musculoskeletal: Negative.   Neurological: Negative.      Physical Exam Triage Vital Signs ED Triage Vitals  Enc Vitals Group     BP      Pulse      Resp      Temp      Temp src      SpO2      Weight      Height      Head Circumference      Peak Flow      Pain Score      Pain Loc      Pain Edu?      Excl. in GC?    No data found.  Updated Vital Signs BP 134/85 (BP Location: Right Arm)   Pulse 86   Temp (!) 97.4 F (36.3 C) (Oral)   Ht 6\' 3"  (1.905 m)   Wt 90.7 kg   SpO2 91%   BMI 25.00 kg/m    Physical Exam Vitals signs and nursing note reviewed.  Constitutional:      Appearance: Normal appearance. He is normal  weight.  HENT:     Head: Normocephalic.  Eyes:     Conjunctiva/sclera: Conjunctivae normal.  Neck:     Musculoskeletal: Normal range of motion and neck supple.  Cardiovascular:     Rate and Rhythm: Normal rate.     Heart sounds: Normal heart sounds.  Pulmonary:     Effort: Pulmonary effort is normal.     Breath sounds: Wheezing, rhonchi and rales present.  Musculoskeletal: Normal range of motion.  Skin:    General: Skin is warm and dry.  Neurological:     General: No focal deficit present.     Mental Status: He is alert.  Psychiatric:        Mood and Affect: Mood normal.        Behavior: Behavior normal.      UC Treatments / Results  Labs (all labs ordered are listed, but only abnormal results are displayed) Labs Reviewed  OTHER LAB TEST    EKG   Radiology No results found.  Procedures Procedures (including critical care time)  Medications Ordered in UC Medications - No data to display  Initial Impression / Assessment and Plan / UC Course  I have reviewed the triage vital  signs and the nursing notes.  Pertinent labs & imaging results that were available during my care of the patient were reviewed by me and considered in my medical decision making (see chart for details).    Final Clinical Impressions(s) / UC Diagnoses   Final diagnoses:  Cough  Viral URI with cough     Discharge Instructions     The x-ray does not show pneumonia.  Instead, you appear to have a very bad bronchitis.  We are starting you on an antibiotic and inhaler.  We are also running a COVID test that should be back in 2 days.  It is very important that you pay attention to the symptoms and if you are getting more short of breath over the next 24 hours, go to the emergency department    ED Prescriptions    Medication Sig Dispense Auth. Provider   azithromycin (ZITHROMAX) 250 MG tablet Take 2 tabs PO x 1 dose, then 1 tab PO QD x 4 days 6 tablet Robyn Haber, MD   albuterol (VENTOLIN HFA) 108 (90 Base) MCG/ACT inhaler Inhale 2 puffs into the lungs every 4 (four) hours as needed for wheezing or shortness of breath (cough, shortness of breath or wheezing.). 6.7 g Robyn Haber, MD     I have reviewed the PDMP during this encounter.   Robyn Haber, MD 05/27/19 [id number]

## 2019-05-27 NOTE — ED Triage Notes (Signed)
Productive cough, thick yellow mucus, SOB x 2 days

## 2019-05-27 NOTE — Discharge Instructions (Addendum)
The x-ray does not show pneumonia.  Instead, you appear to have a very bad bronchitis.  We are starting you on an antibiotic and inhaler.  We are also running a COVID test that should be back in 2 days.  It is very important that you pay attention to the symptoms and if you are getting more short of breath over the next 24 hours, go to the emergency department

## 2020-01-20 ENCOUNTER — Encounter (HOSPITAL_BASED_OUTPATIENT_CLINIC_OR_DEPARTMENT_OTHER): Payer: Self-pay | Admitting: *Deleted

## 2020-01-20 ENCOUNTER — Other Ambulatory Visit: Payer: Self-pay

## 2020-01-20 ENCOUNTER — Emergency Department (HOSPITAL_BASED_OUTPATIENT_CLINIC_OR_DEPARTMENT_OTHER)
Admission: EM | Admit: 2020-01-20 | Discharge: 2020-01-20 | Disposition: A | Discharge status: Home / Self Care | Payer: Medicaid Other | Attending: Emergency Medicine | Admitting: Emergency Medicine

## 2020-01-20 DIAGNOSIS — F17210 Nicotine dependence, cigarettes, uncomplicated: Secondary | ICD-10-CM | POA: Insufficient documentation

## 2020-01-20 DIAGNOSIS — Z202 Contact with and (suspected) exposure to infections with a predominantly sexual mode of transmission: Secondary | ICD-10-CM | POA: Insufficient documentation

## 2020-01-20 DIAGNOSIS — Z7951 Long term (current) use of inhaled steroids: Secondary | ICD-10-CM | POA: Insufficient documentation

## 2020-01-20 LAB — URINALYSIS, ROUTINE W REFLEX MICROSCOPIC
Bilirubin Urine: NEGATIVE
Glucose, UA: NEGATIVE mg/dL
Ketones, ur: 15 mg/dL — AB
Leukocytes,Ua: NEGATIVE
Nitrite: NEGATIVE
Protein, ur: NEGATIVE mg/dL
Specific Gravity, Urine: 1.025 (ref 1.005–1.030)
pH: 7 (ref 5.0–8.0)

## 2020-01-20 LAB — URINALYSIS, MICROSCOPIC (REFLEX)

## 2020-01-20 MED ORDER — LIDOCAINE HCL (PF) 1 % IJ SOLN
INTRAMUSCULAR | Status: AC
  Administered 2020-01-20: 2 mL
  Filled 2020-01-20: qty 5

## 2020-01-20 MED ORDER — CEFTRIAXONE SODIUM 500 MG IJ SOLR
500.0000 mg | Freq: Once | INTRAMUSCULAR | Status: AC
  Administered 2020-01-20: 500 mg via INTRAMUSCULAR
  Filled 2020-01-20: qty 500

## 2020-01-20 MED ORDER — DOXYCYCLINE HYCLATE 100 MG PO CAPS: 100.0000 mg | ORAL_CAPSULE | Freq: Two times a day (BID) | ORAL | 0 refills | Status: AC

## 2020-01-20 NOTE — ED Provider Notes (Signed)
[place] EMERGENCY DEPARTMENT Provider Note   CSN: [id number] Arrival date & time: 01/20/20  1705     History Chief Complaint  Patient presents with  . Exposure to STD    Jason Rosario is a 34 y.o. male.  HPI Patient is a 34 year old male that presents due to STD exposure.  Patient states he had unprotected sex with a male partner about 2 weeks ago.  He states that he was contacted because about 1 week ago she tested positive for chlamydia.  He denies any GU complaints at this time.  He denies abdominal pain, nausea, vomiting, diarrhea, dysuria, hematuria, penile discharge, penile pain, testicular pain, penile swelling, testicular swelling.     History reviewed. No pertinent past medical history.  There are no problems to display for this patient.   Past Surgical History:  Procedure Laterality Date  . INCISION AND DRAINAGE PERITONSILLAR ABSCESS         Family History  Problem Relation Age of Onset  . Healthy Mother   . Healthy Father     Social History   Tobacco Use  . Smoking status: Current Every Day Smoker    Types: Cigarettes  . Smokeless tobacco: Never Used  Substance Use Topics  . Alcohol use: Yes    Comment: occ  . Drug use: Never    Home Medications Prior to Admission medications   Medication Sig Start Date End Date Taking? Authorizing Provider  albuterol (VENTOLIN HFA) 108 (90 Base) MCG/ACT inhaler Inhale 2 puffs into the lungs every 4 (four) hours as needed for wheezing or shortness of breath (cough, shortness of breath or wheezing.). 05/27/19   Robyn Haber, MD  ibuprofen (ADVIL,MOTRIN) 600 MG tablet Take 1 tablet (600 mg total) by mouth every 6 (six) hours as needed. 06/19/18   McDonald, Mia A, PA-C    Allergies    Patient has no known allergies.  Review of Systems   Review of Systems  Genitourinary: Negative.  Negative for decreased urine volume, difficulty urinating, discharge, dysuria, enuresis, flank pain, frequency,  genital sores, hematuria, penile pain, penile swelling, scrotal swelling, testicular pain and urgency.   Physical Exam Updated Vital Signs BP (!) 160/100   Pulse (!) 105   Temp 98.5 F (36.9 C)   Resp 16   Ht 6\' 3"  (1.905 m)   Wt 90.7 kg   SpO2 97%   BMI 25.00 kg/m   Physical Exam Vitals and nursing note reviewed.  Constitutional:      General: He is not in acute distress.    Appearance: Normal appearance. He is not ill-appearing, toxic-appearing or diaphoretic.  HENT:     Head: Normocephalic and atraumatic.     Right Ear: External ear normal.     Left Ear: External ear normal.     Nose: Nose normal.     Mouth/Throat:     Mouth: Mucous membranes are moist.     Pharynx: Oropharynx is clear. No oropharyngeal exudate or posterior oropharyngeal erythema.  Eyes:     Extraocular Movements: Extraocular movements intact.  Cardiovascular:     Rate and Rhythm: Normal rate and regular rhythm.     Pulses: Normal pulses.     Heart sounds: Normal heart sounds. No murmur. No friction rub. No gallop.   Pulmonary:     Effort: Pulmonary effort is normal. No respiratory distress.     Breath sounds: Normal breath sounds. No stridor. No wheezing, rhonchi or rales.  Abdominal:     General: Abdomen  is flat.     Tenderness: There is no abdominal tenderness.  Musculoskeletal:        General: Normal range of motion.     Cervical back: Normal range of motion and neck supple. No tenderness.  Skin:    General: Skin is warm and dry.  Neurological:     General: No focal deficit present.     Mental Status: He is alert and oriented to person, place, and time.  Psychiatric:        Mood and Affect: Mood normal.        Behavior: Behavior normal.    ED Results / Procedures / Treatments   Labs (all labs ordered are listed, but only abnormal results are displayed) Labs Reviewed  URINALYSIS, ROUTINE W REFLEX MICROSCOPIC - Abnormal; Notable for the following components:      Result Value   Hgb  urine dipstick MODERATE (*)    Ketones, ur 15 (*)    All other components within normal limits  URINALYSIS, MICROSCOPIC (REFLEX) - Abnormal; Notable for the following components:   Bacteria, UA FEW (*)    All other components within normal limits  GC/CHLAMYDIA PROBE AMP ([place]) NOT AT [facility]   EKG None  Radiology No results found.  Procedures Procedures (including critical care time)  Medications Ordered in ED Medications  cefTRIAXone (ROCEPHIN) injection 500 mg (500 mg Intramuscular Given 01/20/20 1831)  lidocaine (PF) (XYLOCAINE) 1 % injection (2 mLs  Given 01/20/20 1832)    ED Course  I have reviewed the triage vital signs and the nursing notes.  Pertinent labs & imaging results that were available during my care of the patient were reviewed by me and considered in my medical decision making (see chart for details).    MDM Rules/Calculators/A&P                      Patient is a 34 year old male with history and physical exam as noted above.  He reports a STD exposure that occurred about 2 weeks ago.  He had sexual intercourse with a male partner who later tested positive for chlamydia.  He denies that he used any protection during this time.  He has no physical complaints at this time.  His blood pressure was slightly elevated at today's visit.  He was mildly tachycardic when he first arrived but this is improved.  His vital signs are otherwise stable.  UA shows few bacteria with 15 ketones and moderate hemoglobin.  GC/chlamydia probe is pending.  I discussed these findings with the patient and my recommendation to treat for gonorrhea and chlamydia prophylactically.  Patient was amenable with this plan.  He was given IM Rocephin here in the emergency department and discharged on 1 week of doxycycline.  He understands to return to the emergency department any new or worsening symptoms.  He understands to refrain from sex for the next week.  His questions were answered and he  was amicable at the time of discharge.  His vital signs are stable.  He was given strict return precautions.  Patient discharged to home/self care.  Condition at discharge: Stable  Note: Portions of this report may have been transcribed using voice recognition software. Every effort was made to ensure accuracy; however, inadvertent computerized transcription errors may be present.    Final Clinical Impression(s) / ED Diagnoses Final diagnoses:  STD exposure    Rx / DC Orders ED Discharge Orders  Ordered    doxycycline (VIBRAMYCIN) 100 MG capsule  2 times daily     01/20/20 1836           Placido Sou, PA-C 01/20/20 1918    Alvira Monday, MD 01/22/20 [phone]

## 2020-01-20 NOTE — ED Triage Notes (Signed)
Pt states was exposed to an STD, denies discharged

## 2020-01-20 NOTE — Discharge Instructions (Addendum)
You are being prescribed doxycycline.  This is an antibiotic that you are going to take twice a day for the next week.  Please do not stop taking this early.  Please refrain from sexual intercourse for the next 1 to 2 weeks.    Please return to the emergency department if your symptoms worsen.

## 2020-01-21 LAB — GC/CHLAMYDIA PROBE AMP (~~LOC~~) NOT AT ARMC
Chlamydia: NEGATIVE
Comment: NEGATIVE
Comment: NORMAL
Neisseria Gonorrhea: NEGATIVE

## 2020-07-24 ENCOUNTER — Emergency Department (HOSPITAL_BASED_OUTPATIENT_CLINIC_OR_DEPARTMENT_OTHER)
Admission: EM | Admit: 2020-07-24 | Discharge: 2020-07-24 | Discharge status: Absent without leave | Payer: Medicaid Other

## 2020-07-24 ENCOUNTER — Other Ambulatory Visit: Payer: Self-pay

## 2020-07-24 ENCOUNTER — Encounter (HOSPITAL_BASED_OUTPATIENT_CLINIC_OR_DEPARTMENT_OTHER): Payer: Self-pay | Admitting: Emergency Medicine

## 2020-07-24 ENCOUNTER — Emergency Department (HOSPITAL_BASED_OUTPATIENT_CLINIC_OR_DEPARTMENT_OTHER)
Admission: EM | Admit: 2020-07-24 | Discharge: 2020-07-24 | Disposition: A | Discharge status: Home / Self Care | Payer: Medicaid Other | Attending: Emergency Medicine | Admitting: Emergency Medicine

## 2020-07-24 DIAGNOSIS — J069 Acute upper respiratory infection, unspecified: Secondary | ICD-10-CM | POA: Insufficient documentation

## 2020-07-24 DIAGNOSIS — J029 Acute pharyngitis, unspecified: Secondary | ICD-10-CM | POA: Insufficient documentation

## 2020-07-24 DIAGNOSIS — Z20822 Contact with and (suspected) exposure to covid-19: Secondary | ICD-10-CM | POA: Insufficient documentation

## 2020-07-24 DIAGNOSIS — F17210 Nicotine dependence, cigarettes, uncomplicated: Secondary | ICD-10-CM | POA: Insufficient documentation

## 2020-07-24 HISTORY — DX: Peritonsillar abscess: J36

## 2020-07-24 LAB — RESP PANEL BY RT-PCR (FLU A&B, COVID) ARPGX2
Influenza A by PCR: NEGATIVE
Influenza B by PCR: NEGATIVE
SARS Coronavirus 2 by RT PCR: NEGATIVE

## 2020-07-24 LAB — GROUP A STREP BY PCR: Group A Strep by PCR: NOT DETECTED

## 2020-07-24 MED ORDER — AZITHROMYCIN 500 MG PO TABS: 500.0000 mg | ORAL_TABLET | Freq: Every day | ORAL | 0 refills | Status: AC

## 2020-07-24 NOTE — ED Notes (Signed)
At DC GCS 15, speech WNL, able to swallow and control secretions

## 2020-07-24 NOTE — ED Notes (Signed)
Pt returned to lobby.   

## 2020-07-24 NOTE — Discharge Instructions (Signed)
Lab work and exam look reassuring.  Starting on antibiotics please take as prescribed.  You may take over-the-counter pain medications like ibuprofen or Tylenol every 6 hours as needed please follow dosing on the back of bottle.  You may also take throat louzge and/or tea with honey as this can help with your sore throat.  Recommend follow-up with your PCP in 1 week's time if symptoms have not fully resolved.  Come back to the emergency department if you develop chest pain, shortness of breath, severe abdominal pain, uncontrolled nausea, vomiting, diarrhea.

## 2020-07-24 NOTE — ED Notes (Signed)
DC instructions and AVS reviewed with pt and significant other, work notes also provided to pt and significant other as well, reviewed Rx with pt and suggestions for diet with his current Dx. Opportunity for questions provided. Copy of AVS provided as well.

## 2020-07-24 NOTE — ED Triage Notes (Signed)
Generalized weakness, fever, chills x 4 days. Reports diarrhea the first day but it has subsided. Pt reports soreness to the lymph nodes on the R side of his neck.

## 2020-07-24 NOTE — ED Provider Notes (Signed)
[facility] EMERGENCY DEPARTMENT Provider Note   CSN: [id number] Arrival date & time: 07/24/20  1059     History Chief Complaint  Patient presents with  . Weakness    Jason Rosario is a 34 y.o. male.  HPI   Patient with significant medical history of peritonsillar abscess presents to the emergency department with chief complaint of URI-like symptoms.  Patient states symptoms started about 5 days ago, initially he had nausea and diarrhea but has since resolved, he endorses associated nasal congestion, sore throat, nonproductive cough, with subjective fevers and chills.  He denies general body aches, abdominal pain, or urinary symptoms.  He is not currently vaccine against COVID-19, denies recent sick contacts, is not immunocompromise.  He states he generally just feels weak but is having no difficulty tolerating p.o.  He denies any alleviating factors.  Past Medical History:  Diagnosis Date  . Peritonsillar abscess     There are no problems to display for this patient.   Past Surgical History:  Procedure Laterality Date  . INCISION AND DRAINAGE PERITONSILLAR ABSCESS         Family History  Problem Relation Age of Onset  . Healthy Mother   . Healthy Father     Social History   Tobacco Use  . Smoking status: Current Every Day Smoker    Types: Cigarettes, Cigars  . Smokeless tobacco: Never Used  Vaping Use  . Vaping Use: Never used  Substance Use Topics  . Alcohol use: Yes    Comment: occ  . Drug use: Never    Home Medications Prior to Admission medications   Medication Sig Start Date End Date Taking? Authorizing Provider  albuterol (VENTOLIN HFA) 108 (90 Base) MCG/ACT inhaler Inhale 2 puffs into the lungs every 4 (four) hours as needed for wheezing or shortness of breath (cough, shortness of breath or wheezing.). 05/27/19   Elvina Sidle, MD  azithromycin (ZITHROMAX) 500 MG tablet Take 1 tablet (500 mg total) by mouth daily for 5 days. 07/24/20  07/29/20  Carroll Sage, PA-C  ibuprofen (ADVIL,MOTRIN) 600 MG tablet Take 1 tablet (600 mg total) by mouth every 6 (six) hours as needed. 06/19/18   McDonald, Mia A, PA-C    Allergies    Patient has no known allergies.  Review of Systems   Review of Systems  Constitutional: Negative for chills and fever.  HENT: Positive for congestion and sore throat.   Eyes: Negative for visual disturbance.  Respiratory: Positive for cough. Negative for shortness of breath.   Cardiovascular: Negative for chest pain.  Gastrointestinal: Negative for abdominal pain.  Genitourinary: Negative for enuresis.  Musculoskeletal: Negative for back pain.  Skin: Negative for rash.  Neurological: Negative for headaches.  Hematological: Does not bruise/bleed easily.    Physical Exam Updated Vital Signs BP 119/81   Pulse 72   Temp 98.4 F (36.9 C) (Oral)   Resp 18   Ht 6\' 3"  (1.905 m)   Wt 93 kg   SpO2 100%   BMI 25.62 kg/m   Physical Exam Vitals and nursing note reviewed.  Constitutional:      General: He is not in acute distress.    Appearance: He is not ill-appearing.  HENT:     Head: Normocephalic and atraumatic.     Right Ear: Tympanic membrane, ear canal and external ear normal.     Left Ear: Tympanic membrane, ear canal and external ear normal.     Nose: Congestion present.  Comments: Patient's has bilateral erythematous turbinates, with nasal congestion    Mouth/Throat:     Mouth: Mucous membranes are moist.     Pharynx: Oropharyngeal exudate and posterior oropharyngeal erythema present.     Comments: Patient's oropharynx is visualized tongue and uvula are both midline, tonsils were erythematous bilaterally, left tonsil is slightly larger than the right, exudates present on both. Eyes:     Conjunctiva/sclera: Conjunctivae normal.  Cardiovascular:     Rate and Rhythm: Normal rate and regular rhythm.     Pulses: Normal pulses.     Heart sounds: No murmur heard. No friction rub.  No gallop.   Pulmonary:     Effort: No respiratory distress.     Breath sounds: No wheezing, rhonchi or rales.  Abdominal:     Palpations: Abdomen is soft.     Tenderness: There is no abdominal tenderness.  Musculoskeletal:     Comments: Patient moving all 4 extremities at difficulty.  Skin:    General: Skin is warm and dry.  Neurological:     Mental Status: He is alert.  Psychiatric:        Mood and Affect: Mood normal.     ED Results / Procedures / Treatments   Labs (all labs ordered are listed, but only abnormal results are displayed) Labs Reviewed  RESP PANEL BY RT-PCR (FLU A&B, COVID) [id number]  GROUP A STREP BY PCR    EKG None  Radiology No results found.  Procedures Procedures (including critical care time)  Medications Ordered in ED Medications - No data to display  ED Course  I have reviewed the triage vital signs and the nursing notes.  Pertinent labs & imaging results that were available during my care of the patient were reviewed by me and considered in my medical decision making (see chart for details).    MDM Rules/Calculators/A&P                          Patient presents with URI-like symptoms.  He was alert, does not appear to be in acute distress, vital signs reassuring.  Will order respiratory panel and strep test for further evaluation.  Patient's respiratory panel was negative for Covid, influenza a/B.  Strep test was negative  Low suspicion for systemic infection as patient is nontoxic-appearing, vital signs reassuring, no obvious source infection noted on exam.  Low suspicion for pneumonia as lung sounds are clear bilaterally, will defer imaging at this time. I have low suspicion for PE as patient denies pleuritic chest pain, shortness of breath, patient is PERC. low suspicion for strep throat strep test was negative.  Low suspicion patient would need  hospitalized due to viral infection or Covid as vital signs reassuring, patient is not in  respiratory distress.  Suspect patient suffering from viral URI with viral pharyngitis versus bacterial pharyngitis. due to erythematous tonsils with exudate concern for possible bacterial pharyngitis will start patient on antibiotics and follow-up with PCP for further evaluation.  Vital signs have remained stable, no indication for hospital admission. Patient given at home care as well strict return precautions.  Patient verbalized that they understood agreed to said plan.    Final Clinical Impression(s) / ED Diagnoses Final diagnoses:  Viral upper respiratory tract infection  Sore throat    Rx / DC Orders ED Discharge Orders         Ordered    azithromycin (ZITHROMAX) 500 MG tablet  Daily  07/24/20 1550           Carroll Sage, PA-C 07/24/20 1559    Terrilee Files, MD 07/25/20 [phone]

## 2020-07-24 NOTE — ED Notes (Signed)
Instructed to wait in WR and to sit in W/C due to c/o of weakness. Verbalized understanding

## 2021-01-08 ENCOUNTER — Encounter (HOSPITAL_BASED_OUTPATIENT_CLINIC_OR_DEPARTMENT_OTHER): Payer: Self-pay | Admitting: Emergency Medicine

## 2021-01-08 ENCOUNTER — Emergency Department (HOSPITAL_BASED_OUTPATIENT_CLINIC_OR_DEPARTMENT_OTHER)
Admission: EM | Admit: 2021-01-08 | Discharge: 2021-01-08 | Disposition: A | Discharge status: Home / Self Care | Payer: Medicaid Other | Attending: Emergency Medicine | Admitting: Emergency Medicine

## 2021-01-08 ENCOUNTER — Emergency Department (HOSPITAL_BASED_OUTPATIENT_CLINIC_OR_DEPARTMENT_OTHER): Payer: Medicaid Other

## 2021-01-08 ENCOUNTER — Other Ambulatory Visit: Payer: Self-pay

## 2021-01-08 DIAGNOSIS — F17210 Nicotine dependence, cigarettes, uncomplicated: Secondary | ICD-10-CM | POA: Insufficient documentation

## 2021-01-08 DIAGNOSIS — N489 Disorder of penis, unspecified: Secondary | ICD-10-CM

## 2021-01-08 DIAGNOSIS — N4889 Other specified disorders of penis: Secondary | ICD-10-CM | POA: Insufficient documentation

## 2021-01-08 DIAGNOSIS — R109 Unspecified abdominal pain: Secondary | ICD-10-CM | POA: Insufficient documentation

## 2021-01-08 LAB — CBC WITH DIFFERENTIAL/PLATELET
Abs Immature Granulocytes: 0.03 10*3/uL (ref 0.00–0.07)
Basophils Absolute: 0.1 10*3/uL (ref 0.0–0.1)
Basophils Relative: 1 %
Eosinophils Absolute: 0.1 10*3/uL (ref 0.0–0.5)
Eosinophils Relative: 1 %
HCT: 42.2 % (ref 39.0–52.0)
Hemoglobin: 14.2 g/dL (ref 13.0–17.0)
Immature Granulocytes: 0 %
Lymphocytes Relative: 22 %
Lymphs Abs: 2.3 10*3/uL (ref 0.7–4.0)
MCH: 32.1 pg (ref 26.0–34.0)
MCHC: 33.6 g/dL (ref 30.0–36.0)
MCV: 95.5 fL (ref 80.0–100.0)
Monocytes Absolute: 0.5 10*3/uL (ref 0.1–1.0)
Monocytes Relative: 5 %
Neutro Abs: 7.3 10*3/uL (ref 1.7–7.7)
Neutrophils Relative %: 71 %
Platelets: 260 10*3/uL (ref 150–400)
RBC: 4.42 MIL/uL (ref 4.22–5.81)
RDW: 12.8 % (ref 11.5–15.5)
WBC: 10.3 10*3/uL (ref 4.0–10.5)
nRBC: 0 % (ref 0.0–0.2)

## 2021-01-08 LAB — URINALYSIS, ROUTINE W REFLEX MICROSCOPIC
Bilirubin Urine: NEGATIVE
Glucose, UA: NEGATIVE mg/dL
Ketones, ur: NEGATIVE mg/dL
Leukocytes,Ua: NEGATIVE
Nitrite: NEGATIVE
Protein, ur: NEGATIVE mg/dL
Specific Gravity, Urine: 1.025 (ref 1.005–1.030)
pH: 6 (ref 5.0–8.0)

## 2021-01-08 LAB — HIV ANTIBODY (ROUTINE TESTING W REFLEX): HIV Screen 4th Generation wRfx: NONREACTIVE

## 2021-01-08 LAB — COMPREHENSIVE METABOLIC PANEL
ALT: 14 U/L (ref 0–44)
AST: 22 U/L (ref 15–41)
Albumin: 4 g/dL (ref 3.5–5.0)
Alkaline Phosphatase: 77 U/L (ref 38–126)
Anion gap: 8 (ref 5–15)
BUN: 7 mg/dL (ref 6–20)
CO2: 32 mmol/L (ref 22–32)
Calcium: 9.6 mg/dL (ref 8.9–10.3)
Chloride: 100 mmol/L (ref 98–111)
Creatinine, Ser: 0.74 mg/dL (ref 0.61–1.24)
GFR, Estimated: >60 mL/min (ref >60)
Glucose, Bld: 105 mg/dL — ABNORMAL HIGH (ref 70–99)
Potassium: 3.7 mmol/L (ref 3.5–5.1)
Sodium: 140 mmol/L (ref 135–145)
Total Bilirubin: 0.4 mg/dL (ref 0.3–1.2)
Total Protein: 7.3 g/dL (ref 6.5–8.1)

## 2021-01-08 LAB — URINALYSIS, MICROSCOPIC (REFLEX)

## 2021-01-08 MED ORDER — ONDANSETRON HCL 4 MG/2ML IJ SOLN
4.0000 mg | Freq: Once | INTRAMUSCULAR | Status: DC
  Filled 2021-01-08: qty 2

## 2021-01-08 MED ORDER — KETOROLAC TROMETHAMINE 30 MG/ML IJ SOLN
30.0000 mg | Freq: Once | INTRAMUSCULAR | Status: AC
  Administered 2021-01-08: 30 mg via INTRAVENOUS
  Filled 2021-01-08: qty 1

## 2021-01-08 MED ORDER — SODIUM CHLORIDE 0.9 % IV BOLUS
1000.0000 mL | Freq: Once | INTRAVENOUS | Status: AC
  Administered 2021-01-08: 1000 mL via INTRAVENOUS

## 2021-01-08 NOTE — ED Notes (Signed)
Pt ambulatory with steady gait to restroom to provide urine specimen 

## 2021-01-08 NOTE — ED Provider Notes (Signed)
[facility] EMERGENCY DEPARTMENT Provider Note   CSN: [id number] Arrival date & time: 01/08/21  0820     History Chief Complaint  Patient presents with  . Flank Pain    Jason Rosario is a 35 y.o. male.  HPI     35yo male presents with 3 flank pain Radiates to the lower back, now is right flank, 10/10 waxing and waning. Not worse with eating or drinking. Back pain with walking.  No dysuria, hematuria, penile discharge, penile or scrotal pain NO nausea or vomiting, no constipation, on and off diarrhea but no new concerns No fevers   Also notes pimple area on penis for a few days, coming and going on penis, last time messed with it and sore. Concern for possible STI given symptoms.  Sexually active with male fiance without condoms. 26mos ago was treated for gc/chl  Past Medical History:  Diagnosis Date  . Peritonsillar abscess     There are no problems to display for this patient.   Past Surgical History:  Procedure Laterality Date  . INCISION AND DRAINAGE PERITONSILLAR ABSCESS         Family History  Problem Relation Age of Onset  . Healthy Mother   . Healthy Father     Social History   Tobacco Use  . Smoking status: Current Every Day Smoker    Types: Cigarettes, Cigars  . Smokeless tobacco: Never Used  Vaping Use  . Vaping Use: Never used  Substance Use Topics  . Alcohol use: Not Currently    Comment: occ  . Drug use: Never    Home Medications Prior to Admission medications   Medication Sig Start Date End Date Taking? Authorizing Provider  albuterol (VENTOLIN HFA) 108 (90 Base) MCG/ACT inhaler Inhale 2 puffs into the lungs every 4 (four) hours as needed for wheezing or shortness of breath (cough, shortness of breath or wheezing.). 05/27/19   Elvina Sidle, MD  ibuprofen (ADVIL,MOTRIN) 600 MG tablet Take 1 tablet (600 mg total) by mouth every 6 (six) hours as needed. 06/19/18   McDonald, Mia A, PA-C    Allergies    Patient has no  known allergies.  Review of Systems   Review of Systems  Constitutional: Negative for fever.  HENT: Negative for sore throat.   Eyes: Negative for visual disturbance.  Respiratory: Negative for shortness of breath.   Cardiovascular: Negative for chest pain.  Gastrointestinal: Positive for diarrhea. Negative for abdominal pain, constipation, nausea and vomiting.  Genitourinary: Positive for flank pain and genital sores. Negative for difficulty urinating, dysuria, penile pain, penile swelling and scrotal swelling.  Musculoskeletal: Negative for back pain and neck stiffness.  Skin: Negative for rash.  Neurological: Negative for syncope and headaches.    Physical Exam Updated Vital Signs BP (!) 148/86 (BP Location: Right Arm)   Pulse 91   Temp 98.7 F (37.1 C) (Oral)   Resp 18   Ht 6\' 3"  (1.905 m)   Wt 99.8 kg   SpO2 97%   BMI 27.50 kg/m   Physical Exam Vitals and nursing note reviewed.  Constitutional:      General: He is not in acute distress.    Appearance: He is well-developed. He is not diaphoretic.  HENT:     Head: Normocephalic and atraumatic.  Eyes:     Conjunctiva/sclera: Conjunctivae normal.  Cardiovascular:     Rate and Rhythm: Normal rate and regular rhythm.  Pulmonary:     Effort: Pulmonary effort is normal. No respiratory  distress.     Breath sounds: Normal breath sounds.  Abdominal:     General: There is no distension.     Palpations: Abdomen is soft.     Tenderness: There is no abdominal tenderness. There is right CVA tenderness. There is no guarding.  Genitourinary:    Comments: Tiny healing papule on glans Musculoskeletal:     Cervical back: Normal range of motion.  Skin:    General: Skin is warm and dry.  Neurological:     Mental Status: He is alert and oriented to person, place, and time.     ED Results / Procedures / Treatments   Labs (all labs ordered are listed, but only abnormal results are displayed) Labs Reviewed  URINALYSIS, ROUTINE  W REFLEX MICROSCOPIC - Abnormal; Notable for the following components:      Result Value   Hgb urine dipstick SMALL (*)    All other components within normal limits  COMPREHENSIVE METABOLIC PANEL - Abnormal; Notable for the following components:   Glucose, Bld 105 (*)    All other components within normal limits  URINALYSIS, MICROSCOPIC (REFLEX) - Abnormal; Notable for the following components:   Bacteria, UA FEW (*)    All other components within normal limits  URINE CULTURE  CBC WITH DIFFERENTIAL/PLATELET  HIV ANTIBODY (ROUTINE TESTING W REFLEX)  RPR  GC/CHLAMYDIA PROBE AMP ([place]) NOT AT [facility]    EKG None  Radiology CT Renal Stone Study  Result Date: 01/08/2021 CLINICAL DATA:  Flank pain with kidney stone suspected EXAM: CT ABDOMEN AND PELVIS WITHOUT CONTRAST TECHNIQUE: Multidetector CT imaging of the abdomen and pelvis was performed following the standard protocol without IV contrast. COMPARISON:  None. FINDINGS: Lower chest:  No contributory findings. Hepatobiliary: No focal liver abnormality.Tortuous gallbladder, of no suspected clinical significance. No evidence of biliary obstruction or stone. Pancreas: Unremarkable. Spleen: Unremarkable. Adrenals/Urinary Tract: Negative adrenals. No hydronephrosis or stone. Unremarkable bladder. Stomach/Bowel:  No obstruction. No appendicitis. Vascular/Lymphatic: No acute vascular abnormality. No mass or adenopathy. Reproductive:No pathologic findings. Other: No ascites or pneumoperitoneum. Musculoskeletal: No acute abnormalities. IMPRESSION: No explanation for abdominal pain. Electronically Signed   By: Marnee Spring M.D.   On: 01/08/2021 10:07    Procedures Procedures   Medications Ordered in ED Medications  sodium chloride 0.9 % bolus 1,000 mL ( Intravenous Stopped 01/08/21 1043)  ketorolac (TORADOL) 30 MG/ML injection 30 mg (30 mg Intravenous Given 01/08/21 0940)    ED Course  I have reviewed the triage vital signs and the  nursing notes.  Pertinent labs & imaging results that were available during my care of the patient were reviewed by me and considered in my medical decision making (see chart for details).    MDM Rules/Calculators/A&P                          35 year old male presents with concern for right flank pain and STI check.  Regarding right flank pain, differential diagnosis includes nephrolithiasis, pyelonephritis, cholecystitis, cholelithiasis, appendicitis.  CT stone study was completed which showed no evidence of acute abnormalities.  CMP without acute abnormalities.  Lesion on penis healing and at this time not consistent with genital wart, moluscum, syphilis or HSV.  Recommend outpatient follow up. Sent labs for HIV/RPR. UA without infection. Possible MSK etiology of pain, recommend supportive care and PCP follow up.   Final Clinical Impression(s) / ED Diagnoses Final diagnoses:  Right flank pain  Penile lesion    Rx /  DC Orders ED Discharge Orders    None       Alvira Monday, MD 01/10/21 [phone]

## 2021-01-08 NOTE — ED Triage Notes (Signed)
Pt arrives pov with R flank pain x 3 days. Pt denies dysuria, endorses urinary frequency. Pt denies fever, denies penile discharge

## 2021-01-09 LAB — RPR: RPR Ser Ql: NONREACTIVE

## 2021-01-10 LAB — GC/CHLAMYDIA PROBE AMP (~~LOC~~) NOT AT ARMC
Chlamydia: NEGATIVE
Comment: NEGATIVE
Comment: NORMAL
Neisseria Gonorrhea: NEGATIVE

## 2021-01-10 LAB — URINE CULTURE: Culture: NO GROWTH

## 2021-07-28 ENCOUNTER — Other Ambulatory Visit: Payer: Self-pay

## 2021-07-28 ENCOUNTER — Encounter (HOSPITAL_COMMUNITY): Payer: Self-pay | Admitting: Emergency Medicine

## 2021-07-28 ENCOUNTER — Emergency Department (HOSPITAL_COMMUNITY): Payer: Medicaid Other

## 2021-07-28 ENCOUNTER — Emergency Department (HOSPITAL_COMMUNITY)
Admission: EM | Admit: 2021-07-28 | Discharge: 2021-07-28 | Disposition: A | Discharge status: Home / Self Care | Payer: Medicaid Other | Attending: Emergency Medicine | Admitting: Emergency Medicine

## 2021-07-28 DIAGNOSIS — F17210 Nicotine dependence, cigarettes, uncomplicated: Secondary | ICD-10-CM | POA: Insufficient documentation

## 2021-07-28 DIAGNOSIS — T40601A Poisoning by unspecified narcotics, accidental (unintentional), initial encounter: Secondary | ICD-10-CM | POA: Insufficient documentation

## 2021-07-28 LAB — CBC WITH DIFFERENTIAL/PLATELET
Abs Immature Granulocytes: 0.02 10*3/uL (ref 0.00–0.07)
Basophils Absolute: 0.1 10*3/uL (ref 0.0–0.1)
Basophils Relative: 1 %
Eosinophils Absolute: 0 10*3/uL (ref 0.0–0.5)
Eosinophils Relative: 0 %
HCT: 41.5 % (ref 39.0–52.0)
Hemoglobin: 13.7 g/dL (ref 13.0–17.0)
Immature Granulocytes: 0 %
Lymphocytes Relative: 21 %
Lymphs Abs: 1.8 10*3/uL (ref 0.7–4.0)
MCH: 32.8 pg (ref 26.0–34.0)
MCHC: 33 g/dL (ref 30.0–36.0)
MCV: 99.3 fL (ref 80.0–100.0)
Monocytes Absolute: 0.8 10*3/uL (ref 0.1–1.0)
Monocytes Relative: 9 %
Neutro Abs: 5.9 10*3/uL (ref 1.7–7.7)
Neutrophils Relative %: 69 %
Platelets: 142 10*3/uL — ABNORMAL LOW (ref 150–400)
RBC: 4.18 MIL/uL — ABNORMAL LOW (ref 4.22–5.81)
RDW: 12.3 % (ref 11.5–15.5)
WBC: 8.5 10*3/uL (ref 4.0–10.5)
nRBC: 0 % (ref 0.0–0.2)

## 2021-07-28 LAB — ETHANOL: Alcohol, Ethyl (B): <10 mg/dL (ref <10)

## 2021-07-28 LAB — RAPID URINE DRUG SCREEN, HOSP PERFORMED
Amphetamines: NOT DETECTED
Barbiturates: NOT DETECTED
Benzodiazepines: NOT DETECTED
Cocaine: POSITIVE — AB
Opiates: NOT DETECTED
Tetrahydrocannabinol: NOT DETECTED

## 2021-07-28 LAB — COMPREHENSIVE METABOLIC PANEL
ALT: 16 U/L (ref 0–44)
AST: 29 U/L (ref 15–41)
Albumin: 4.9 g/dL (ref 3.5–5.0)
Alkaline Phosphatase: 69 U/L (ref 38–126)
Anion gap: 12 (ref 5–15)
BUN: 11 mg/dL (ref 6–20)
CO2: 29 mmol/L (ref 22–32)
Calcium: 9 mg/dL (ref 8.9–10.3)
Chloride: 95 mmol/L — ABNORMAL LOW (ref 98–111)
Creatinine, Ser: 0.84 mg/dL (ref 0.61–1.24)
GFR, Estimated: >60 mL/min (ref >60)
Glucose, Bld: 157 mg/dL — ABNORMAL HIGH (ref 70–99)
Potassium: 3.1 mmol/L — ABNORMAL LOW (ref 3.5–5.1)
Sodium: 136 mmol/L (ref 135–145)
Total Bilirubin: 0.5 mg/dL (ref 0.3–1.2)
Total Protein: 8.1 g/dL (ref 6.5–8.1)

## 2021-07-28 MED ORDER — LACTATED RINGERS IV BOLUS
1000.0000 mL | Freq: Once | INTRAVENOUS | Status: AC
  Administered 2021-07-28: 1000 mL via INTRAVENOUS

## 2021-07-28 MED ORDER — ONDANSETRON HCL 4 MG/2ML IJ SOLN
4.0000 mg | Freq: Once | INTRAMUSCULAR | Status: AC
  Administered 2021-07-28: 4 mg via INTRAVENOUS
  Filled 2021-07-28: qty 2

## 2021-07-28 NOTE — ED Notes (Signed)
Nurse spoke with pt concerning heroin addiction. Nurse informed pt of different avenues of help he can receive to get help with his addiction. Pt voiced his awakening of his actions today. Pt has the desire to get clean and live a drug free life. Nurse spoke with pt about starting therapy for his root issues of why he does heroin. Talked about his family and friends who could support him. Nurse advised pt to start writing in a journal to help with emotions.

## 2021-07-28 NOTE — ED Notes (Signed)
Pt requesting oxygen at 2 lpm while he naps.

## 2021-07-28 NOTE — ED Provider Notes (Signed)
[facility] EMERGENCY DEPARTMENT Provider Note  CSN: [id number] Arrival date & time: 07/28/21 1445    History Chief Complaint  Patient presents with   Drug Overdose    Jason Rosario is a 35 y.o. male brought to the ED by significant other for evaluation. He was doing fentanyl yesterday and today since release from prison and became unresponsive. EMS was called and he was given narcan at which point he woke up and was not transported to the ED. He has been feeling SOB and vomiting since then. He has been feeling weak and nauseated since yesterday. Denies any CP. Denies other drug or alcohol use. Feels like he might be dehydrated.    Past Medical History:  Diagnosis Date   Peritonsillar abscess     Past Surgical History:  Procedure Laterality Date   INCISION AND DRAINAGE PERITONSILLAR ABSCESS      Family History  Problem Relation Age of Onset   Healthy Mother    Healthy Father     Social History   Tobacco Use   Smoking status: Every Day    Types: Cigarettes, Cigars   Smokeless tobacco: Never  Vaping Use   Vaping Use: Never used  Substance Use Topics   Alcohol use: Not Currently    Comment: occ   Drug use: Never    Comment: fentanyl     Home Medications Prior to Admission medications   Medication Sig Start Date End Date Taking? Authorizing Provider  albuterol (VENTOLIN HFA) 108 (90 Base) MCG/ACT inhaler Inhale 2 puffs into the lungs every 4 (four) hours as needed for wheezing or shortness of breath (cough, shortness of breath or wheezing.). 05/27/19   Elvina Sidle, MD  ibuprofen (ADVIL,MOTRIN) 600 MG tablet Take 1 tablet (600 mg total) by mouth every 6 (six) hours as needed. 06/19/18   McDonald, Mia A, PA-C     Allergies    Patient has no known allergies.   Review of Systems   Review of Systems A comprehensive review of systems was completed and negative except as noted in HPI.    Physical Exam BP (!) 133/96    Pulse 79    Temp 98.5 F (36.9 C)  (Oral)    Resp 13    Ht 6\' 3"  (1.905 m)    Wt 99.8 kg    SpO2 100%    BMI 27.50 kg/m   Physical Exam Vitals and nursing note reviewed.  Constitutional:      Appearance: Normal appearance.     Comments: vomiting  HENT:     Head: Normocephalic and atraumatic.     Nose: Nose normal.     Mouth/Throat:     Mouth: Mucous membranes are moist.  Eyes:     Extraocular Movements: Extraocular movements intact.     Conjunctiva/sclera: Conjunctivae normal.  Cardiovascular:     Rate and Rhythm: Normal rate.  Pulmonary:     Effort: Pulmonary effort is normal.     Breath sounds: Normal breath sounds.  Abdominal:     General: Abdomen is flat.     Palpations: Abdomen is soft.     Tenderness: There is no abdominal tenderness.  Musculoskeletal:        General: No swelling. Normal range of motion.     Cervical back: Neck supple.  Skin:    General: Skin is warm and dry.  Neurological:     General: No focal deficit present.     Mental Status: He is alert.  Psychiatric:  Mood and Affect: Mood normal.     ED Results / Procedures / Treatments   Labs (all labs ordered are listed, but only abnormal results are displayed) Labs Reviewed  COMPREHENSIVE METABOLIC PANEL - Abnormal; Notable for the following components:      Result Value   Potassium 3.1 (*)    Chloride 95 (*)    Glucose, Bld 157 (*)    All other components within normal limits  CBC WITH DIFFERENTIAL/PLATELET - Abnormal; Notable for the following components:   RBC 4.18 (*)    Platelets 142 (*)    All other components within normal limits  RAPID URINE DRUG SCREEN, HOSP PERFORMED - Abnormal; Notable for the following components:   Cocaine POSITIVE (*)    All other components within normal limits  ETHANOL    EKG EKG Interpretation  Date/Time:  Friday July 28 2021 15:55:23 EST Ventricular Rate:  87 PR Interval:  172 QRS Duration: 89 QT Interval:  359 QTC Calculation: 432 R Axis:   30 Text Interpretation: Sinus  rhythm ST elev, probable normal early repol pattern No old tracing to compare Confirmed by Susy Frizzle [phone]) on 07/28/2021 4:17:01 PM  Radiology DG Chest 2 View  Result Date: 07/28/2021 CLINICAL DATA:  Shortness of breath.  Opiate overdose. EXAM: CHEST - 2 VIEW COMPARISON:  Chest x-ray dated May 27, 2019. FINDINGS: The heart size and mediastinal contours are within normal limits. Both lungs are clear. The visualized skeletal structures are unremarkable. IMPRESSION: No active cardiopulmonary disease. Electronically Signed   By: Obie Dredge M.D.   On: 07/28/2021 15:56    Procedures Procedures  Medications Ordered in the ED Medications  ondansetron (ZOFRAN) injection 4 mg (4 mg Intravenous Given 07/28/21 1511)  lactated ringers bolus 1,000 mL (0 mLs Intravenous Stopped 07/28/21 1634)     MDM Rules/Calculators/A&P MDM Patient with vomiting and SOB after fentanyl overdose and narcan administration. He is awake and alert now. Will check labs, give zofran and IVF, CXR for pulm edema after narcan.   ED Course  I have reviewed the triage vital signs and the nursing notes.  Pertinent labs & imaging results that were available during my care of the patient were reviewed by me and considered in my medical decision making (see chart for details).  Clinical Course as of 07/28/21 1703  Fri Jul 28, 2021  1643 UDS positive for cocaine, fentanyl not likely to show on this assay. EtOH is neg.  [CS]  1644 CXR is clear. CBC is normal.  [CS]  1701 Patient is feeling better, remains awake and alert. No more vomiting. Advised to avoid any further drug use. Outpatient resources given. RTED for any other concerns.  [CS]    Clinical Course User Index [CS] Pollyann Savoy, MD    Final Clinical Impression(s) / ED Diagnoses Final diagnoses:  Opiate overdose, accidental or unintentional, initial encounter [facility])    Rx / DC Orders ED Discharge Orders     None        Pollyann Savoy, MD 07/28/21 [phone]

## 2021-07-28 NOTE — ED Triage Notes (Signed)
Pt to the ED after snorting a "Hit" of fentanyl and becoming unresponsive.  EMS gave him one nasal narcan and he became conscious.  Pt arrives POV complaining of shortness of breath.

## 2021-08-12 ENCOUNTER — Emergency Department (HOSPITAL_BASED_OUTPATIENT_CLINIC_OR_DEPARTMENT_OTHER)
Admission: EM | Admit: 2021-08-12 | Discharge: 2021-08-12 | Disposition: A | Discharge status: Home / Self Care | Payer: Medicaid Other | Attending: Emergency Medicine | Admitting: Emergency Medicine

## 2021-08-12 ENCOUNTER — Encounter (HOSPITAL_BASED_OUTPATIENT_CLINIC_OR_DEPARTMENT_OTHER): Payer: Self-pay | Admitting: Emergency Medicine

## 2021-08-12 ENCOUNTER — Other Ambulatory Visit: Payer: Self-pay | Admitting: Emergency Medicine

## 2021-08-12 ENCOUNTER — Other Ambulatory Visit: Payer: Self-pay

## 2021-08-12 DIAGNOSIS — Z8616 Personal history of COVID-19: Secondary | ICD-10-CM | POA: Insufficient documentation

## 2021-08-12 DIAGNOSIS — R21 Rash and other nonspecific skin eruption: Secondary | ICD-10-CM | POA: Insufficient documentation

## 2021-08-12 DIAGNOSIS — F17210 Nicotine dependence, cigarettes, uncomplicated: Secondary | ICD-10-CM | POA: Insufficient documentation

## 2021-08-12 DIAGNOSIS — R55 Syncope and collapse: Secondary | ICD-10-CM | POA: Insufficient documentation

## 2021-08-12 DIAGNOSIS — R39198 Other difficulties with micturition: Secondary | ICD-10-CM | POA: Insufficient documentation

## 2021-08-12 LAB — URINALYSIS, ROUTINE W REFLEX MICROSCOPIC
Bilirubin Urine: NEGATIVE
Glucose, UA: NEGATIVE mg/dL
Hgb urine dipstick: NEGATIVE
Ketones, ur: NEGATIVE mg/dL
Leukocytes,Ua: NEGATIVE
Nitrite: NEGATIVE
Protein, ur: NEGATIVE mg/dL
Specific Gravity, Urine: 1.02 (ref 1.005–1.030)
pH: 8.5 — ABNORMAL HIGH (ref 5.0–8.0)

## 2021-08-12 NOTE — ED Triage Notes (Signed)
Pt states had Covid 2 weeks ago, passed out and was admitted, has had balance issues since.   Has to "push really hard " to urinate and has rash on genitals.

## 2021-08-12 NOTE — ED Provider Notes (Signed)
[facility] Provider Note: Jason Dell, MD, FACEP  CSN: [id number] MRN: [id number] ARRIVAL: 08/12/21 at 0531 ROOM: MH02/MH02   CHIEF COMPLAINT  Rash   HISTORY OF PRESENT ILLNESS  08/12/21 5:50 AM Jason Rosario is a 35 y.o. male who alleges he had a syncopal episode 2 weeks ago and was diagnosed with COVID at "[facility] on [address] in [place]".  A review of his medical records reveals no visits to [facility] in the past year.  He was seen in the ED at [facility] on 07/28/2021 for a fentanyl overdose requiring Narcan resuscitation.  He did not have a COVID test at that time.  He states that since the alleged COVID diagnosis he has felt off balance.   He states for the past week he has had difficulty urinating and feels like he has to take extra time to start his stream.  He denies any dysuria or pain with urination.  He is having no abdominal pain.  He did have several lesions on the shaft of his penis recently but they are now resolving.  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   Past Medical History:  Diagnosis Date   Peritonsillar abscess     Past Surgical History:  Procedure Laterality Date   INCISION AND DRAINAGE PERITONSILLAR ABSCESS      Family History  Problem Relation Age of Onset   Healthy Mother    Healthy Father     Social History   Tobacco Use   Smoking status: Every Day    Types: Cigarettes, Cigars   Smokeless tobacco: Never  Vaping Use   Vaping Use: Never used  Substance Use Topics   Alcohol use: Not Currently    Comment: occ   Drug use: Never    Comment: fentanyl    Prior to Admission medications   Medication Sig Start Date End Date Taking? Authorizing Provider  albuterol (VENTOLIN HFA) 108 (90 Base) MCG/ACT inhaler Inhale 2 puffs into the lungs every 4 (four) hours as needed for wheezing or shortness of breath (cough, shortness  of breath or wheezing.). 05/27/19   Elvina Sidle, MD  ibuprofen (ADVIL,MOTRIN) 600 MG tablet Take 1 tablet (600 mg total) by mouth every 6 (six) hours as needed. 06/19/18   McDonald, Mia A, PA-C    Allergies Patient has no known allergies.   REVIEW OF SYSTEMS  Negative except as noted here or in the History of Present Illness.   PHYSICAL EXAMINATION  Initial Vital Signs Blood pressure (!) 165/117, pulse (!) 104, temperature 98.3 F (36.8 C), temperature source Oral, resp. rate 18, height 6\' 3"  (1.905 m), weight 99.8 kg, SpO2 98 %.  Examination General: Well-developed, well-nourished male in no acute distress; appearance consistent with age of record HENT: normocephalic; atraumatic Eyes: Normal appearance Neck: supple Heart: regular rate and rhythm Lungs: clear to auscultation bilaterally Abdomen: soft; nondistended; nontender; bowel sounds present GU: Tanner V male, circumcised; no urethral discharge; several healing lesions of the shaft of the penis distally, they have the appearance of healing vesicles but are not crusted; cloudy urine Extremities: No deformity; full range of motion; pulses normal Neurologic: Awake, alert and oriented; motor function intact in all extremities and symmetric; no facial droop; normal coordination, speech and gait; no pronator drift; normal finger-to-nose Skin: Warm and dry Psychiatric: Normal mood and affect   RESULTS  Summary of this  visit's results, reviewed and interpreted by myself:   EKG Interpretation  Date/Time:    Ventricular Rate:    PR Interval:    QRS Duration:   QT Interval:    QTC Calculation:   R Axis:     Text Interpretation:         Laboratory Studies: Results for orders placed or performed during the hospital encounter of 08/12/21 (from the past 24 hour(s))  Urinalysis, Routine w reflex microscopic Urethra     Status: Abnormal   Collection Time: 08/12/21  6:10 AM  Result Value Ref Range   Color, Urine YELLOW  YELLOW   APPearance CLOUDY (A) CLEAR   Specific Gravity, Urine 1.020 1.005 - 1.030   pH 8.5 (H) 5.0 - 8.0   Glucose, UA NEGATIVE NEGATIVE mg/dL   Hgb urine dipstick NEGATIVE NEGATIVE   Bilirubin Urine NEGATIVE NEGATIVE   Ketones, ur NEGATIVE NEGATIVE mg/dL   Protein, ur NEGATIVE NEGATIVE mg/dL   Nitrite NEGATIVE NEGATIVE   Leukocytes,Ua NEGATIVE NEGATIVE   Imaging Studies: No results found.  ED COURSE and MDM  Nursing notes, initial and subsequent vitals signs, including pulse oximetry, reviewed and interpreted by myself.  Vitals:   08/12/21 0542 08/12/21 0544 08/12/21 0615  BP:  (!) 165/117 (!) 167/136  Pulse:  (!) 104 (!) 107  Resp:  18 18  Temp:  98.3 F (36.8 C)   TempSrc:  Oral   SpO2:  98% 97%  Weight: 99.8 kg    Height: 6\' 3"  (1.905 m)     Medications - No data to display  No objective evidence of ataxia or difficulty with balance on physical exam.  There is no [facility] facility on [facility] in [place].  That is the address of [facility] but there is no record of any visit to [facility].  He did have, as noted above, a visit to [facility] for a fentanyl overdose.  It is unclear if he is confused or misrepresenting recent events.  The lesions on his penis resemble healing herpetic vesicles but are not crusted.  They are not amenable to herpes culture at this time because they are open and dry.  They are not consistent with lichenification.  6:34 AM The patient's urine was cloudy but urinalysis is normal.  His urethral swab for gonorrhea and chlamydia which are pending.  His neurologic exam is normal despite reporting a feeling of being off balance.  His difficulty starting his stream could be due to an anticholinergic drug but he denies any current drug use, having recently discontinued a course of steroids (for what?).  He is not having pain that would be suggestive of prostatitis.  PROCEDURES  Procedures   ED DIAGNOSES     ICD-10-CM    1. Rash of genitalia  R21     2. Difficulty urinating  R39.198          [facility], MD 08/12/21 [phone]

## 2021-08-15 LAB — HSV 1 ANTIBODY, IGG: HSV 1 Glycoprotein G Ab, IgG: 59.1 index — ABNORMAL HIGH (ref 0.00–0.90)

## 2021-08-15 LAB — GC/CHLAMYDIA PROBE AMP (~~LOC~~) NOT AT ARMC
Chlamydia: NEGATIVE
Comment: NEGATIVE
Comment: NORMAL
Neisseria Gonorrhea: NEGATIVE

## 2021-08-16 LAB — MOLECULAR ANCILLARY ONLY
Chlamydia: NEGATIVE
Comment: NEGATIVE
Comment: NORMAL
Neisseria Gonorrhea: NEGATIVE

## 2021-08-16 LAB — HSV-2 IGG SUPPLEMENTAL TEST: HSV-2 IgG Supplemental Test: POSITIVE — AB

## 2021-08-16 LAB — HSV 2 ANTIBODY, IGG: HSV 2 Glycoprotein G Ab, IgG: 3.84 index — ABNORMAL HIGH (ref 0.00–0.90)

## 2022-09-23 ENCOUNTER — Encounter (HOSPITAL_BASED_OUTPATIENT_CLINIC_OR_DEPARTMENT_OTHER): Payer: Self-pay | Admitting: Emergency Medicine

## 2022-09-23 ENCOUNTER — Other Ambulatory Visit: Payer: Self-pay

## 2022-09-23 ENCOUNTER — Emergency Department (HOSPITAL_BASED_OUTPATIENT_CLINIC_OR_DEPARTMENT_OTHER)
Admission: EM | Admit: 2022-09-23 | Discharge: 2022-09-23 | Disposition: A | Discharge status: Home / Self Care | Payer: Medicaid Other | Attending: Emergency Medicine | Admitting: Emergency Medicine

## 2022-09-23 DIAGNOSIS — K5900 Constipation, unspecified: Secondary | ICD-10-CM | POA: Diagnosis present

## 2022-09-23 DIAGNOSIS — K5641 Fecal impaction: Secondary | ICD-10-CM | POA: Insufficient documentation

## 2022-09-23 MED ORDER — BISACODYL 10 MG RE SUPP: 10.0000 mg | Freq: Every day | RECTAL | 0 refills | Status: AC | PRN

## 2022-09-23 MED ORDER — LIDOCAINE HCL URETHRAL/MUCOSAL 2 % EX GEL
1.0000 | Freq: Once | CUTANEOUS | Status: AC
  Administered 2022-09-23: 1 via TOPICAL
  Filled 2022-09-23: qty 11

## 2022-09-23 MED ORDER — MINERAL OIL RE ENEM: 1.0000 | ENEMA | Freq: Once | RECTAL | 2 refills | Status: AC

## 2022-09-23 NOTE — Discharge Instructions (Addendum)
At home use the urojet numbing jelly as a lubricant to put a Dulcolax suppository into your rectum.  Make sure the suppository sits inside your rectum for 60 minutes at least to dissolve.  You can also try to use the enema at the same time to put extra fluid into your rectum.  This will help soften your stool.  Take your time with your bowel movement.  Give your rectum a chance to relax for your bowel movement.

## 2022-09-23 NOTE — ED Triage Notes (Addendum)
Pt reports he is unable have a bowel movement for the past few days. Last bowel movement 6 days ago. Feels like he can go but stool is too hard or large. No known history of GI issues. Pt adds he also is having intermittent issues urinating and bilateral lower back pain.

## 2022-09-23 NOTE — ED Provider Notes (Signed)
[place] EMERGENCY DEPARTMENT AT [facility] Provider Note   CSN: [id number] Arrival date & time: 09/23/22  [id number]     History  Chief Complaint  Patient presents with   Constipation    Jason Rosario is a 37 y.o. male here with difficulty having BM in 6 days.  No report of vomiting, no hx of SBO.  He tends to get constipated chronically per his report.  He does not feel he drinks enough water.  He says he feels that he has a large piece of stool that is having difficulty coming out of his rectum.  HPI     Home Medications Prior to Admission medications   Medication Sig Start Date End Date Taking? Authorizing Provider  bisacodyl (DULCOLAX) 10 MG suppository Place 1 suppository (10 mg total) rectally daily as needed for up to 10 doses for moderate constipation. 09/23/22  Yes Lydie Stammen, Carola Rhine, MD  mineral oil enema Place 133 mLs (1 enema total) rectally once for 1 dose. 09/23/22 09/23/22 Yes Analisia Kingsford, Carola Rhine, MD      Allergies    Patient has no known allergies.    Review of Systems   Review of Systems  Physical Exam Updated Vital Signs BP (!) 132/93   Pulse 92   Temp 98 F (36.7 C) (Oral)   Resp 16   SpO2 100%  Physical Exam Constitutional:      General: He is not in acute distress. HENT:     Head: Normocephalic and atraumatic.  Eyes:     Conjunctiva/sclera: Conjunctivae normal.     Pupils: Pupils are equal, round, and reactive to light.  Cardiovascular:     Rate and Rhythm: Normal rate and regular rhythm.  Pulmonary:     Effort: Pulmonary effort is normal. No respiratory distress.  Skin:    General: Skin is warm and dry.  Neurological:     General: No focal deficit present.     Mental Status: He is alert and oriented to person, place, and time. Mental status is at baseline.  Psychiatric:        Mood and Affect: Mood normal.        Behavior: Behavior normal.     ED Results / Procedures / Treatments   Labs (all labs ordered are listed, but only  abnormal results are displayed) Labs Reviewed - No data to display  EKG None  Radiology No results found.  Procedures Procedures    Medications Ordered in ED Medications  lidocaine (XYLOCAINE) 2 % jelly 1 Application (has no administration in time range)    ED Course/ Medical Decision Making/ A&P                             Medical Decision Making Risk OTC drugs.   Patient is here with suspected fecal impaction may be related to dehydration.  No evidence of bowel obstruction versus clinical exam.  No indication for emergent CT imaging of the abdomen.  He appears comfortable on exam.  We discussed rectal regimen which would include a suppository, urojet numbing jelly, and an enema, which should help him produce a bowel movement.  He will try this at home.  Return precautions discussed        Final Clinical Impression(s) / ED Diagnoses Final diagnoses:  Fecal impaction ([place])    Rx / DC Orders ED Discharge Orders          Ordered  bisacodyl (DULCOLAX) 10 MG suppository  Daily PRN        09/23/22 0744    mineral oil enema   Once       Note to Pharmacy: Can also offer soap spuds or similar enema formula if this is not available   09/23/22 0744              Wyvonnia Dusky, MD 09/23/22 [phone]

## 2023-09-07 IMAGING — CR DG CHEST 2V
2 series · 2 of 2 positions shown · non-contrast
Comparison: Chest x-ray dated May 27, 2019.

CLINICAL DATA: Shortness of breath.  Opiate overdose.

EXAM:
CHEST - 2 VIEW

[w pa chest]
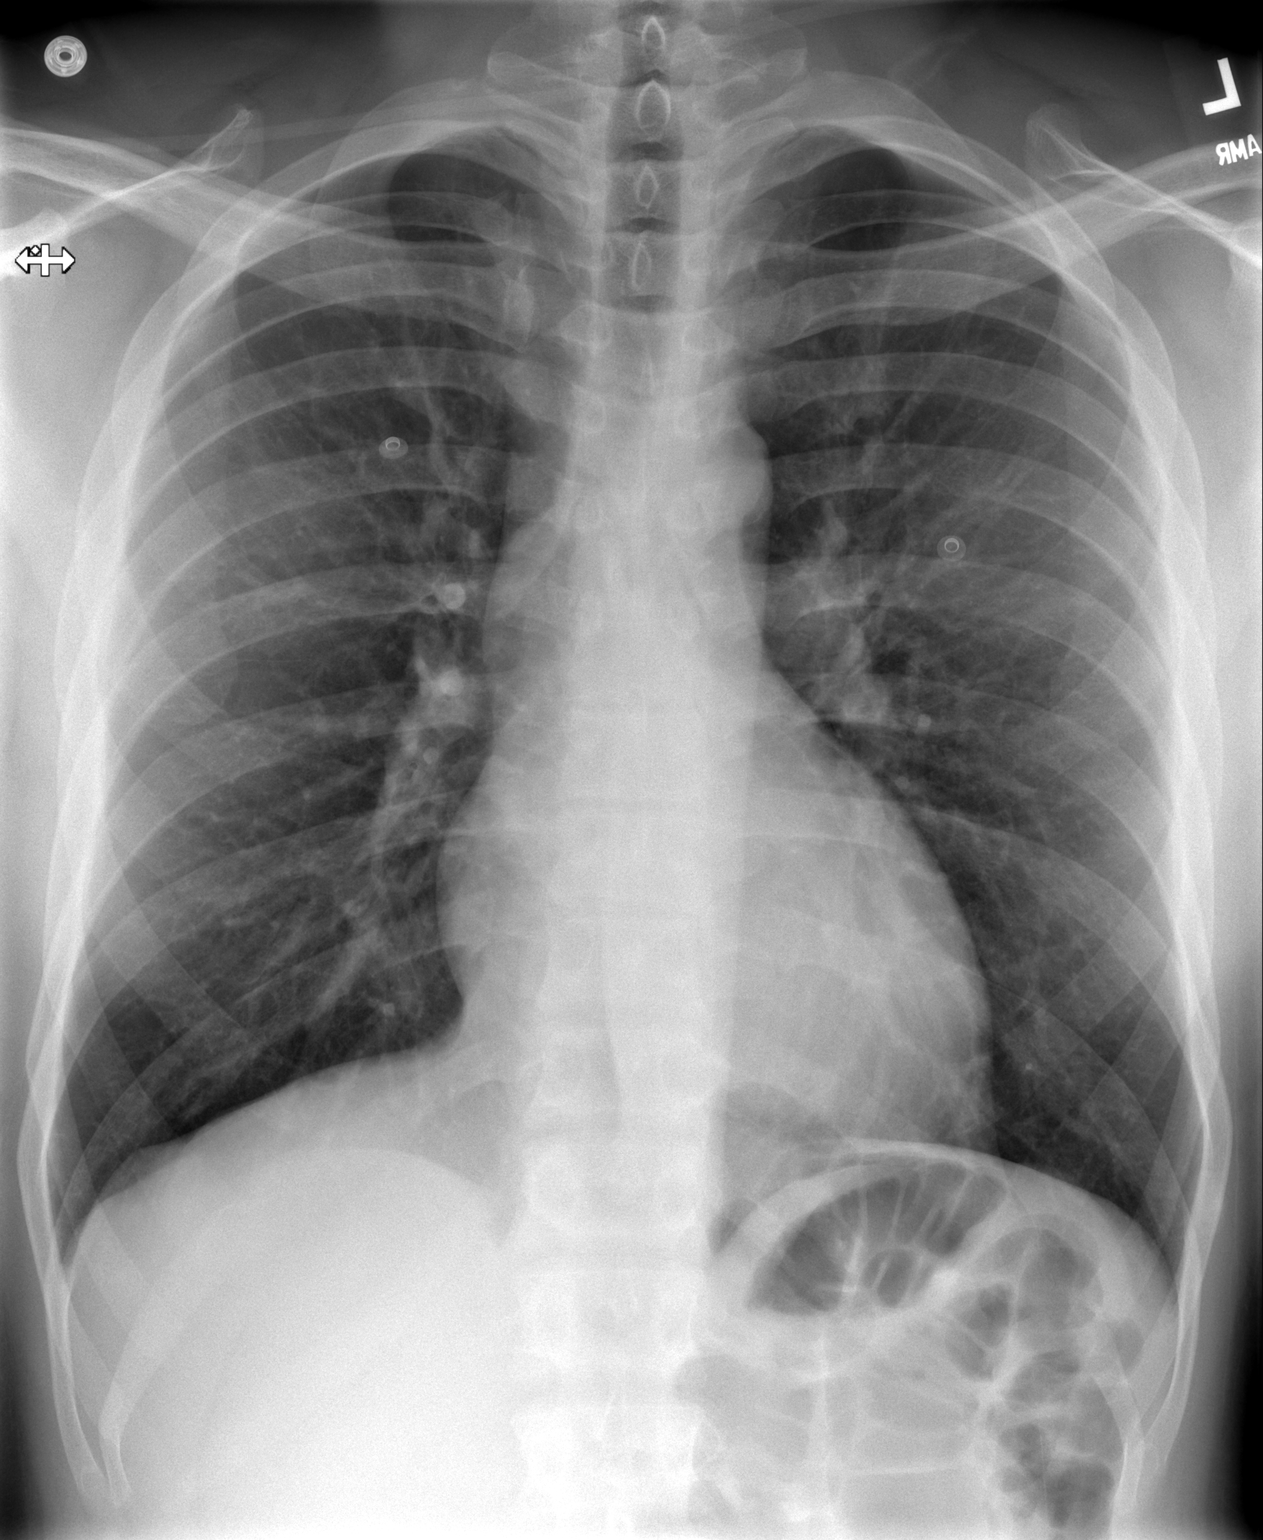

[w chest lat]
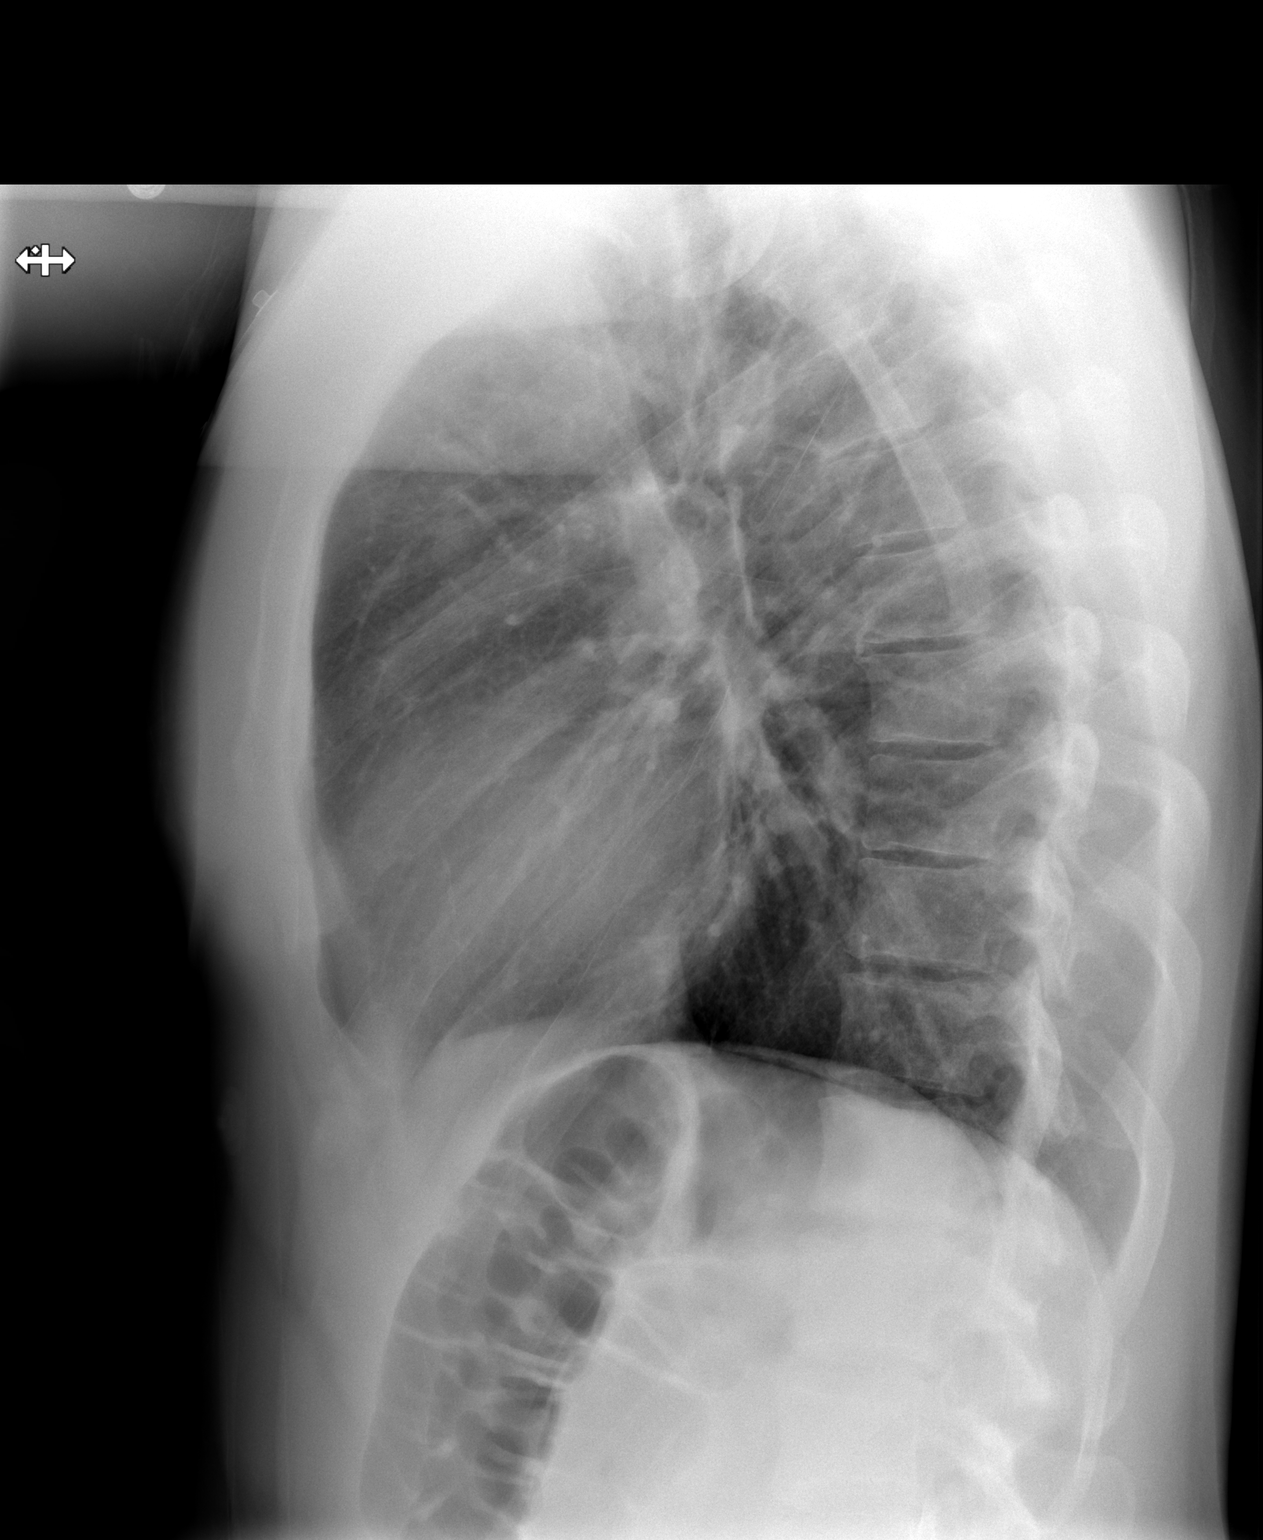

[2 of 2 positions shown; findings below may reference images not displayed]

FINDINGS: The heart size and mediastinal contours are within normal limits.
Both lungs are clear. The visualized skeletal structures are
unremarkable.
IMPRESSION: No active cardiopulmonary disease.
